# Patient Record
Sex: Male | Born: 1960 | Race: White | Hispanic: No | Smoking: Current every day smoker
Health system: Southern US, Community
[De-identification: ages and names within clinical notes are randomized; demographics above are authoritative.]

## PROBLEM LIST (undated history)

## (undated) DIAGNOSIS — E7800 Pure hypercholesterolemia, unspecified: Secondary | ICD-10-CM

## (undated) DIAGNOSIS — I38 Endocarditis, valve unspecified: Secondary | ICD-10-CM

## (undated) DIAGNOSIS — R000 Tachycardia, unspecified: Secondary | ICD-10-CM

## (undated) DIAGNOSIS — J449 Chronic obstructive pulmonary disease, unspecified: Secondary | ICD-10-CM

## (undated) DIAGNOSIS — S2249XA Multiple fractures of ribs, unspecified side, initial encounter for closed fracture: Secondary | ICD-10-CM

## (undated) DIAGNOSIS — I426 Alcoholic cardiomyopathy: Secondary | ICD-10-CM

## (undated) DIAGNOSIS — S2239XA Fracture of one rib, unspecified side, initial encounter for closed fracture: Secondary | ICD-10-CM

## (undated) HISTORY — PX: BACK SURGERY: SHX140

---

## 2005-02-15 ENCOUNTER — Emergency Department: Payer: Self-pay | Admitting: Emergency Medicine

## 2005-12-06 ENCOUNTER — Emergency Department: Payer: Self-pay | Admitting: Emergency Medicine

## 2006-01-31 ENCOUNTER — Ambulatory Visit: Payer: Self-pay | Admitting: General Practice

## 2006-02-24 ENCOUNTER — Emergency Department: Payer: Self-pay | Admitting: Emergency Medicine

## 2006-02-24 ENCOUNTER — Emergency Department: Payer: Self-pay

## 2008-02-12 ENCOUNTER — Encounter
Admission: RE | Admit: 2008-02-12 | Discharge: 2008-02-12 | Payer: Self-pay | Admitting: Physical Medicine & Rehabilitation

## 2008-05-21 ENCOUNTER — Inpatient Hospital Stay: Payer: Self-pay | Admitting: *Deleted

## 2008-05-22 ENCOUNTER — Inpatient Hospital Stay: Payer: Self-pay | Admitting: Psychiatry

## 2009-08-10 ENCOUNTER — Emergency Department: Payer: Self-pay | Admitting: Emergency Medicine

## 2009-11-20 ENCOUNTER — Emergency Department: Payer: Self-pay | Admitting: Emergency Medicine

## 2010-01-08 ENCOUNTER — Emergency Department: Payer: Self-pay | Admitting: Emergency Medicine

## 2010-02-04 ENCOUNTER — Inpatient Hospital Stay: Payer: Self-pay | Admitting: Psychiatry

## 2010-03-10 ENCOUNTER — Emergency Department: Payer: Self-pay | Admitting: Emergency Medicine

## 2010-03-12 ENCOUNTER — Emergency Department: Payer: Self-pay | Admitting: Emergency Medicine

## 2010-12-21 ENCOUNTER — Emergency Department: Payer: Self-pay | Admitting: Unknown Physician Specialty

## 2010-12-23 ENCOUNTER — Emergency Department: Payer: Self-pay | Admitting: Emergency Medicine

## 2010-12-30 ENCOUNTER — Emergency Department: Payer: Self-pay | Admitting: Emergency Medicine

## 2011-01-23 ENCOUNTER — Emergency Department: Payer: Self-pay | Admitting: Emergency Medicine

## 2011-03-16 ENCOUNTER — Emergency Department: Payer: Self-pay | Admitting: Emergency Medicine

## 2011-05-16 ENCOUNTER — Emergency Department: Payer: Self-pay | Admitting: Unknown Physician Specialty

## 2011-07-04 ENCOUNTER — Emergency Department: Payer: Self-pay | Admitting: Emergency Medicine

## 2011-08-03 ENCOUNTER — Emergency Department: Payer: Self-pay | Admitting: Emergency Medicine

## 2011-09-12 ENCOUNTER — Emergency Department: Payer: Self-pay

## 2011-09-12 LAB — CBC
HCT: 51.2 % (ref 40.0–52.0)
HGB: 17.4 g/dL (ref 13.0–18.0)
MCH: 33.9 pg (ref 26.0–34.0)
MCHC: 33.9 g/dL (ref 32.0–36.0)
MCV: 100 fL (ref 80–100)
RDW: 13.6 % (ref 11.5–14.5)
WBC: 7.1 10*3/uL (ref 3.8–10.6)

## 2011-09-12 LAB — URINALYSIS, COMPLETE
Bacteria: NONE SEEN
Glucose,UR: NEGATIVE mg/dL (ref 0–75)
Leukocyte Esterase: NEGATIVE
Nitrite: NEGATIVE
Ph: 5 (ref 4.5–8.0)
Protein: NEGATIVE
Specific Gravity: 1.001 (ref 1.003–1.030)
Squamous Epithelial: <1
WBC UR: 2 /HPF (ref 0–5)

## 2011-09-12 LAB — ETHANOL
Ethanol %: 0.143 % — ABNORMAL HIGH (ref 0.000–0.080)
Ethanol: 281 mg/dL
Ethanol: 143 mg/dL

## 2011-09-12 LAB — COMPREHENSIVE METABOLIC PANEL
Albumin: 4.2 g/dL (ref 3.4–5.0)
Alkaline Phosphatase: 59 U/L (ref 50–136)
BUN: 7 mg/dL (ref 7–18)
Bilirubin,Total: 0.4 mg/dL (ref 0.2–1.0)
Creatinine: 0.77 mg/dL (ref 0.60–1.30)
EGFR (Non-African Amer.): >60
Glucose: 100 mg/dL — ABNORMAL HIGH (ref 65–99)
Osmolality: 270 (ref 275–301)
SGPT (ALT): 60 U/L
Sodium: 136 mmol/L (ref 136–145)
Total Protein: 8.7 g/dL — ABNORMAL HIGH (ref 6.4–8.2)

## 2011-09-12 LAB — DRUG SCREEN, URINE
Barbiturates, Ur Screen: NEGATIVE (ref ?–200)
Cannabinoid 50 Ng, Ur ~~LOC~~: NEGATIVE (ref ?–50)
Cocaine Metabolite,Ur ~~LOC~~: NEGATIVE (ref ?–300)
MDMA (Ecstasy)Ur Screen: NEGATIVE (ref ?–500)
Methadone, Ur Screen: NEGATIVE (ref ?–300)
Phencyclidine (PCP) Ur S: NEGATIVE (ref ?–25)

## 2011-11-28 ENCOUNTER — Emergency Department: Payer: Self-pay | Admitting: Unknown Physician Specialty

## 2011-11-28 LAB — BASIC METABOLIC PANEL
BUN: 4 mg/dL — ABNORMAL LOW (ref 7–18)
Calcium, Total: 8.6 mg/dL (ref 8.5–10.1)
Chloride: 102 mmol/L (ref 98–107)
Co2: 25 mmol/L (ref 21–32)
EGFR (Non-African Amer.): >60
Glucose: 84 mg/dL (ref 65–99)
Osmolality: 266 (ref 275–301)

## 2011-11-28 LAB — CBC
HCT: 46.1 % (ref 40.0–52.0)
MCHC: 33.3 g/dL (ref 32.0–36.0)
Platelet: 176 10*3/uL (ref 150–440)
RBC: 4.51 10*6/uL (ref 4.40–5.90)
RDW: 14.3 % (ref 11.5–14.5)

## 2011-11-28 LAB — CK TOTAL AND CKMB (NOT AT ARMC): CK-MB: <0.5 ng/mL — ABNORMAL LOW (ref 0.5–3.6)

## 2011-12-10 ENCOUNTER — Emergency Department: Payer: Self-pay | Admitting: Emergency Medicine

## 2011-12-10 LAB — DRUG SCREEN, URINE
Barbiturates, Ur Screen: NEGATIVE (ref ?–200)
Benzodiazepine, Ur Scrn: NEGATIVE (ref ?–200)
Cannabinoid 50 Ng, Ur ~~LOC~~: NEGATIVE (ref ?–50)
MDMA (Ecstasy)Ur Screen: NEGATIVE (ref ?–500)
Methadone, Ur Screen: NEGATIVE (ref ?–300)
Phencyclidine (PCP) Ur S: NEGATIVE (ref ?–25)
Tricyclic, Ur Screen: NEGATIVE (ref ?–1000)

## 2011-12-10 LAB — CBC
HGB: 16.7 g/dL (ref 13.0–18.0)
MCH: 34.7 pg — ABNORMAL HIGH (ref 26.0–34.0)
MCV: 101 fL — ABNORMAL HIGH (ref 80–100)
Platelet: 162 10*3/uL (ref 150–440)
RDW: 14.7 % — ABNORMAL HIGH (ref 11.5–14.5)

## 2011-12-10 LAB — ETHANOL
Ethanol %: 0.331 % (ref 0.000–0.080)
Ethanol: 331 mg/dL

## 2011-12-10 LAB — COMPREHENSIVE METABOLIC PANEL
Albumin: 4.2 g/dL (ref 3.4–5.0)
Alkaline Phosphatase: 58 U/L (ref 50–136)
BUN: 4 mg/dL — ABNORMAL LOW (ref 7–18)
Chloride: 101 mmol/L (ref 98–107)
Co2: 26 mmol/L (ref 21–32)
Creatinine: 0.7 mg/dL (ref 0.60–1.30)
EGFR (Non-African Amer.): >60
Osmolality: 270 (ref 275–301)
SGOT(AST): 61 U/L — ABNORMAL HIGH (ref 15–37)
SGPT (ALT): 39 U/L
Sodium: 137 mmol/L (ref 136–145)

## 2011-12-10 LAB — SALICYLATE LEVEL: Salicylates, Serum: 4.2 mg/dL — ABNORMAL HIGH

## 2011-12-10 LAB — URINALYSIS, COMPLETE
Bacteria: NONE SEEN
Glucose,UR: NEGATIVE mg/dL (ref 0–75)
Protein: NEGATIVE
RBC,UR: NONE SEEN /HPF (ref 0–5)
Specific Gravity: 1.001 (ref 1.003–1.030)
Squamous Epithelial: NONE SEEN
WBC UR: <1 /HPF (ref 0–5)

## 2011-12-10 LAB — TSH: Thyroid Stimulating Horm: 5.22 u[IU]/mL — ABNORMAL HIGH

## 2011-12-10 LAB — ACETAMINOPHEN LEVEL: Acetaminophen: <2 ug/mL

## 2011-12-11 LAB — ETHANOL
Ethanol %: 0.043 % (ref 0.000–0.080)
Ethanol: 43 mg/dL

## 2011-12-16 ENCOUNTER — Emergency Department: Payer: Self-pay | Admitting: Emergency Medicine

## 2011-12-16 LAB — CBC
HCT: 44.5 % (ref 40.0–52.0)
HGB: 15.4 g/dL (ref 13.0–18.0)
MCHC: 34.6 g/dL (ref 32.0–36.0)
MCV: 101 fL — ABNORMAL HIGH (ref 80–100)
Platelet: 173 10*3/uL (ref 150–440)
RBC: 4.42 10*6/uL (ref 4.40–5.90)

## 2011-12-16 LAB — COMPREHENSIVE METABOLIC PANEL
BUN: 5 mg/dL — ABNORMAL LOW (ref 7–18)
Bilirubin,Total: 0.3 mg/dL (ref 0.2–1.0)
Calcium, Total: 8.2 mg/dL — ABNORMAL LOW (ref 8.5–10.1)
Chloride: 100 mmol/L (ref 98–107)
Co2: 21 mmol/L (ref 21–32)
EGFR (Non-African Amer.): >60
Potassium: 3.6 mmol/L (ref 3.5–5.1)
SGOT(AST): 57 U/L — ABNORMAL HIGH (ref 15–37)

## 2011-12-16 LAB — URINALYSIS, COMPLETE
Bacteria: NONE SEEN
Bilirubin,UR: NEGATIVE
Glucose,UR: NEGATIVE mg/dL (ref 0–75)
Leukocyte Esterase: NEGATIVE
Protein: NEGATIVE
RBC,UR: 1 /HPF (ref 0–5)
Squamous Epithelial: NONE SEEN
WBC UR: 1 /HPF (ref 0–5)

## 2011-12-16 LAB — PROTIME-INR
INR: 0.9
Prothrombin Time: 12 secs (ref 11.5–14.7)

## 2011-12-16 LAB — ETHANOL
Ethanol %: 0.338 % (ref 0.000–0.080)
Ethanol %: 0.2 % — ABNORMAL HIGH (ref 0.000–0.080)
Ethanol: 338 mg/dL
Ethanol: 200 mg/dL
Ethanol: 96 mg/dL

## 2011-12-16 LAB — CK TOTAL AND CKMB (NOT AT ARMC)
CK, Total: 115 U/L (ref 35–232)
CK-MB: 0.7 ng/mL (ref 0.5–3.6)

## 2011-12-26 ENCOUNTER — Emergency Department: Payer: Self-pay | Admitting: Emergency Medicine

## 2011-12-26 LAB — DRUG SCREEN, URINE
Barbiturates, Ur Screen: NEGATIVE (ref ?–200)
Benzodiazepine, Ur Scrn: NEGATIVE (ref ?–200)
Cannabinoid 50 Ng, Ur ~~LOC~~: NEGATIVE (ref ?–50)
Cocaine Metabolite,Ur ~~LOC~~: NEGATIVE (ref ?–300)
MDMA (Ecstasy)Ur Screen: NEGATIVE (ref ?–500)
Methadone, Ur Screen: NEGATIVE (ref ?–300)
Phencyclidine (PCP) Ur S: NEGATIVE (ref ?–25)
Tricyclic, Ur Screen: NEGATIVE (ref ?–1000)

## 2011-12-26 LAB — COMPREHENSIVE METABOLIC PANEL
Albumin: 3.7 g/dL (ref 3.4–5.0)
Anion Gap: 9 (ref 7–16)
BUN: 6 mg/dL — ABNORMAL LOW (ref 7–18)
Calcium, Total: 8.7 mg/dL (ref 8.5–10.1)
EGFR (African American): >60
EGFR (Non-African Amer.): >60
SGOT(AST): 50 U/L — ABNORMAL HIGH (ref 15–37)
SGPT (ALT): 49 U/L
Total Protein: 7.8 g/dL (ref 6.4–8.2)

## 2011-12-26 LAB — URINALYSIS, COMPLETE
Bacteria: NONE SEEN
Leukocyte Esterase: NEGATIVE
Nitrite: NEGATIVE
Protein: NEGATIVE
Squamous Epithelial: <1
WBC UR: <1 /HPF (ref 0–5)

## 2011-12-26 LAB — ETHANOL
Ethanol %: 0.297 % — ABNORMAL HIGH (ref 0.000–0.080)
Ethanol: 297 mg/dL

## 2011-12-26 LAB — CBC
MCHC: 34.2 g/dL (ref 32.0–36.0)
MCV: 102 fL — ABNORMAL HIGH (ref 80–100)
Platelet: 180 10*3/uL (ref 150–440)
RBC: 4.64 10*6/uL (ref 4.40–5.90)
RDW: 15.1 % — ABNORMAL HIGH (ref 11.5–14.5)
WBC: 7.5 10*3/uL (ref 3.8–10.6)

## 2011-12-27 LAB — ETHANOL
Ethanol %: 0.041 % (ref 0.000–0.080)
Ethanol: 41 mg/dL

## 2012-01-12 ENCOUNTER — Emergency Department: Payer: Self-pay | Admitting: Emergency Medicine

## 2012-01-12 LAB — COMPREHENSIVE METABOLIC PANEL
Albumin: 3.8 g/dL (ref 3.4–5.0)
Alkaline Phosphatase: 61 U/L (ref 50–136)
Anion Gap: 6 — ABNORMAL LOW (ref 7–16)
Bilirubin,Total: 0.3 mg/dL (ref 0.2–1.0)
Calcium, Total: 8.8 mg/dL (ref 8.5–10.1)
Creatinine: 0.73 mg/dL (ref 0.60–1.30)
EGFR (African American): >60
Glucose: 93 mg/dL (ref 65–99)
Osmolality: 270 (ref 275–301)
Potassium: 4.3 mmol/L (ref 3.5–5.1)
Sodium: 137 mmol/L (ref 136–145)

## 2012-01-12 LAB — DRUG SCREEN, URINE
Amphetamines, Ur Screen: NEGATIVE (ref ?–1000)
Barbiturates, Ur Screen: NEGATIVE (ref ?–200)
Benzodiazepine, Ur Scrn: NEGATIVE (ref ?–200)
Cannabinoid 50 Ng, Ur ~~LOC~~: NEGATIVE (ref ?–50)
Cocaine Metabolite,Ur ~~LOC~~: POSITIVE (ref ?–300)
Opiate, Ur Screen: NEGATIVE (ref ?–300)
Phencyclidine (PCP) Ur S: NEGATIVE (ref ?–25)
Tricyclic, Ur Screen: NEGATIVE (ref ?–1000)

## 2012-01-12 LAB — ETHANOL: Ethanol %: 0.32 % (ref 0.000–0.080)

## 2012-01-12 LAB — CBC
HGB: 16.3 g/dL (ref 13.0–18.0)
MCH: 35.1 pg — ABNORMAL HIGH (ref 26.0–34.0)
MCV: 102 fL — ABNORMAL HIGH (ref 80–100)
RBC: 4.66 10*6/uL (ref 4.40–5.90)

## 2012-01-12 LAB — SALICYLATE LEVEL: Salicylates, Serum: 3.5 mg/dL — ABNORMAL HIGH

## 2012-01-12 LAB — ACETAMINOPHEN LEVEL: Acetaminophen: <2 ug/mL

## 2012-01-15 ENCOUNTER — Emergency Department: Payer: Self-pay | Admitting: *Deleted

## 2012-01-15 LAB — COMPREHENSIVE METABOLIC PANEL
Albumin: 3.7 g/dL (ref 3.4–5.0)
Anion Gap: 9 (ref 7–16)
Calcium, Total: 8.4 mg/dL — ABNORMAL LOW (ref 8.5–10.1)
Chloride: 105 mmol/L (ref 98–107)
Co2: 27 mmol/L (ref 21–32)
EGFR (African American): >60
EGFR (Non-African Amer.): >60
Osmolality: 277 (ref 275–301)
Potassium: 3.5 mmol/L (ref 3.5–5.1)
Sodium: 141 mmol/L (ref 136–145)

## 2012-01-15 LAB — ETHANOL
Ethanol %: 0.222 % — ABNORMAL HIGH (ref 0.000–0.080)
Ethanol %: 0.005 % (ref 0.000–0.080)
Ethanol: 222 mg/dL

## 2012-01-15 LAB — URINALYSIS, COMPLETE
Bacteria: NONE SEEN
Bilirubin,UR: NEGATIVE
Blood: NEGATIVE
Glucose,UR: NEGATIVE mg/dL (ref 0–75)
Ketone: NEGATIVE
Protein: NEGATIVE
Specific Gravity: 1.002 (ref 1.003–1.030)
Squamous Epithelial: <1

## 2012-01-15 LAB — DRUG SCREEN, URINE
Amphetamines, Ur Screen: NEGATIVE (ref ?–1000)
Barbiturates, Ur Screen: NEGATIVE (ref ?–200)
Benzodiazepine, Ur Scrn: NEGATIVE (ref ?–200)
Cocaine Metabolite,Ur ~~LOC~~: NEGATIVE (ref ?–300)
Methadone, Ur Screen: NEGATIVE (ref ?–300)
Opiate, Ur Screen: NEGATIVE (ref ?–300)
Phencyclidine (PCP) Ur S: NEGATIVE (ref ?–25)
Tricyclic, Ur Screen: NEGATIVE (ref ?–1000)

## 2012-01-15 LAB — CBC
HCT: 46.8 % (ref 40.0–52.0)
MCH: 35.3 pg — ABNORMAL HIGH (ref 26.0–34.0)
MCHC: 34.3 g/dL (ref 32.0–36.0)
MCV: 103 fL — ABNORMAL HIGH (ref 80–100)
Platelet: 188 10*3/uL (ref 150–440)
WBC: 6.8 10*3/uL (ref 3.8–10.6)

## 2012-01-15 LAB — TSH: Thyroid Stimulating Horm: 5.45 u[IU]/mL — ABNORMAL HIGH

## 2012-03-08 ENCOUNTER — Emergency Department: Payer: Self-pay | Admitting: Emergency Medicine

## 2012-03-08 LAB — DRUG SCREEN, URINE
Amphetamines, Ur Screen: NEGATIVE (ref ?–1000)
Benzodiazepine, Ur Scrn: NEGATIVE (ref ?–200)
Cocaine Metabolite,Ur ~~LOC~~: POSITIVE (ref ?–300)
Methadone, Ur Screen: NEGATIVE (ref ?–300)
Tricyclic, Ur Screen: NEGATIVE (ref ?–1000)

## 2012-03-08 LAB — COMPREHENSIVE METABOLIC PANEL
Albumin: 3.5 g/dL (ref 3.4–5.0)
Alkaline Phosphatase: 58 U/L (ref 50–136)
Anion Gap: 10 (ref 7–16)
BUN: 3 mg/dL — ABNORMAL LOW (ref 7–18)
Glucose: 90 mg/dL (ref 65–99)
Osmolality: 268 (ref 275–301)
Potassium: 3.8 mmol/L (ref 3.5–5.1)
SGOT(AST): 89 U/L — ABNORMAL HIGH (ref 15–37)
SGPT (ALT): 84 U/L — ABNORMAL HIGH (ref 12–78)
Sodium: 136 mmol/L (ref 136–145)
Total Protein: 7.6 g/dL (ref 6.4–8.2)

## 2012-03-08 LAB — TSH: Thyroid Stimulating Horm: 3.48 u[IU]/mL

## 2012-03-08 LAB — SALICYLATE LEVEL: Salicylates, Serum: 3.9 mg/dL — ABNORMAL HIGH

## 2012-03-08 LAB — ACETAMINOPHEN LEVEL: Acetaminophen: <2 ug/mL

## 2012-03-08 LAB — TROPONIN I
Troponin-I: <0.02 ng/mL
Troponin-I: <0.02 ng/mL

## 2012-03-08 LAB — CBC
Platelet: 174 10*3/uL (ref 150–440)
RDW: 14.3 % (ref 11.5–14.5)
WBC: 6.8 10*3/uL (ref 3.8–10.6)

## 2012-03-19 ENCOUNTER — Emergency Department: Payer: Self-pay | Admitting: Emergency Medicine

## 2012-03-19 LAB — URINALYSIS, COMPLETE
Bacteria: NONE SEEN
Blood: NEGATIVE
Glucose,UR: NEGATIVE mg/dL (ref 0–75)
Ketone: NEGATIVE
Nitrite: NEGATIVE
Protein: NEGATIVE
Specific Gravity: 1.001 (ref 1.003–1.030)
Squamous Epithelial: NONE SEEN
WBC UR: NONE SEEN /HPF (ref 0–5)

## 2012-05-01 IMAGING — CR DG CHEST 1V PORT
1 series · 1 of 1 positions shown · non-contrast
Comparison: none

REASON FOR EXAM: Chest Pain
COMMENTS:

PROCEDURE:     DXR - DXR PORTABLE CHEST SINGLE VIEW  - February 04, 2010  [DATE]
RESULT:     Comparison is made to a prior study dated 11/20/2009.

[view not recorded]
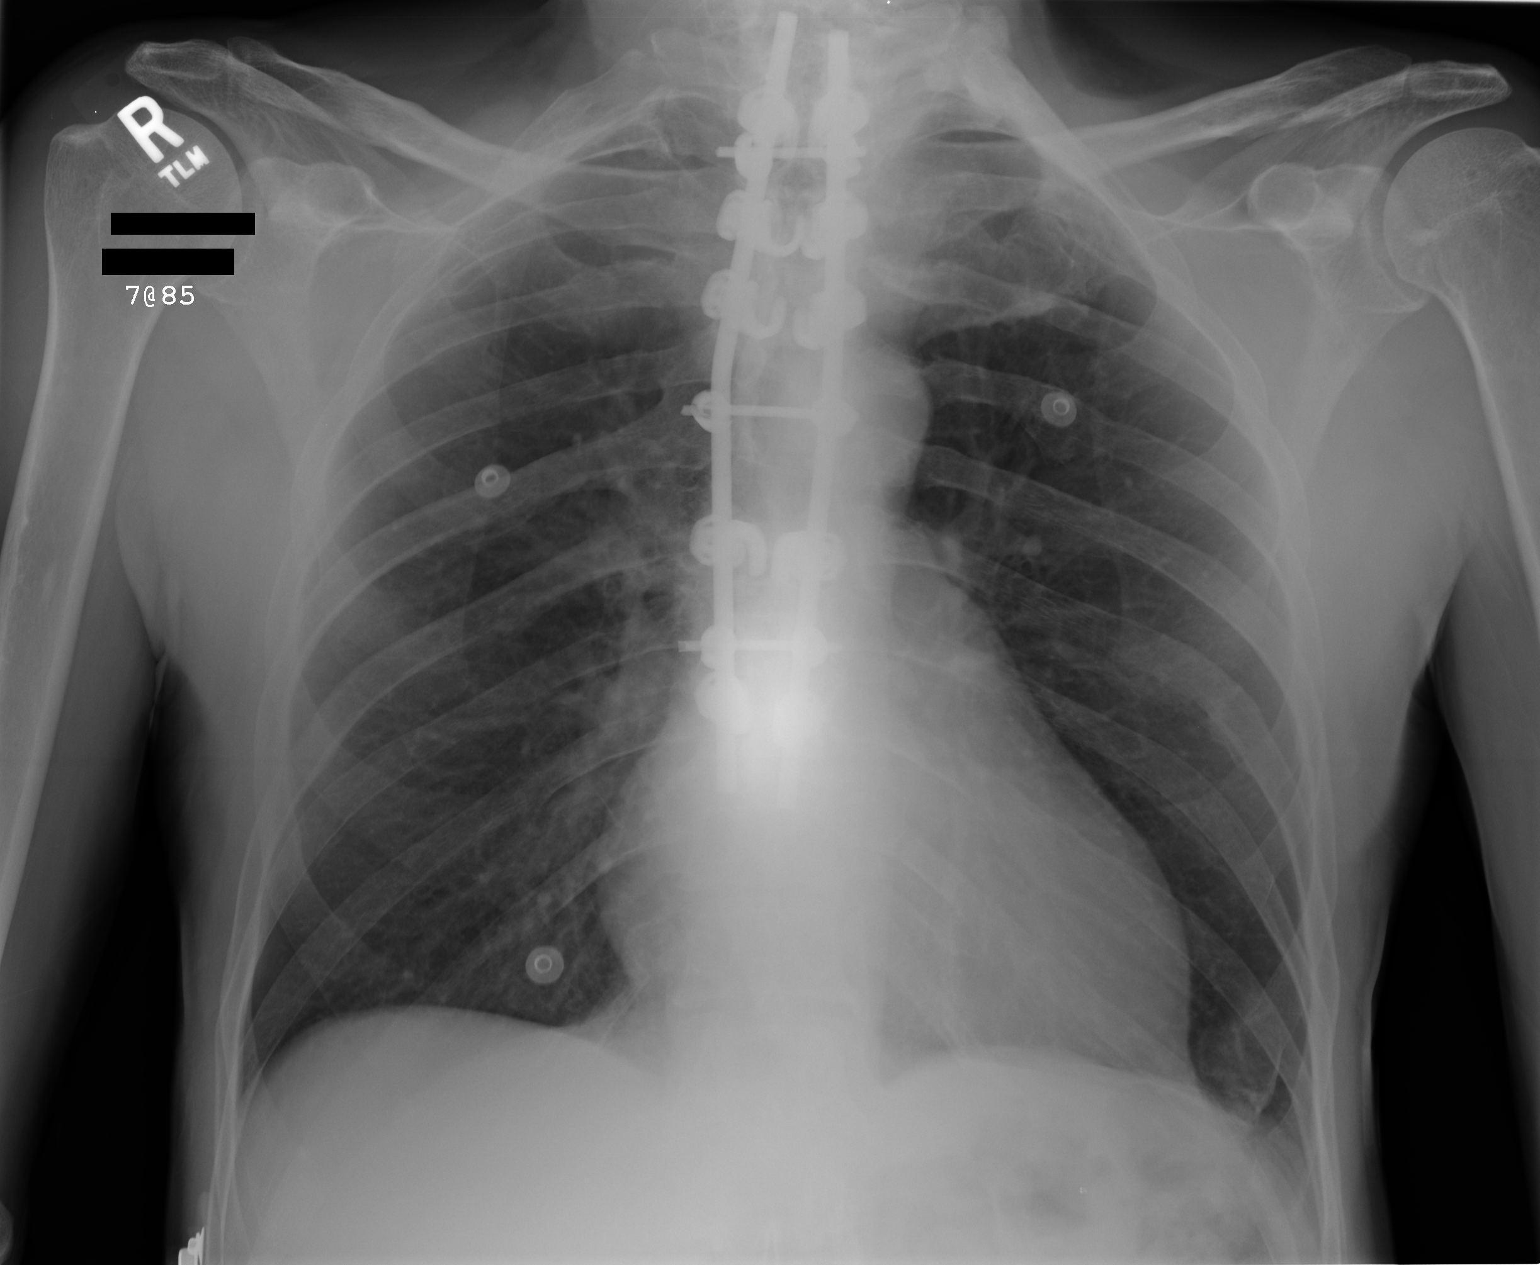

[1 of 1 positions shown; findings below may reference images not displayed]

FINDINGS: There is no evidence of focal infiltrates, effusions or edema. The
cardiac silhouette is within normal limits. The patient is status post rod
and screw fixation of the thoracic spine.
IMPRESSION: Chest radiograph without evidence of acute cardiopulmonary
disease.

## 2012-05-07 LAB — CBC WITH DIFFERENTIAL/PLATELET
Eosinophil %: 6.1 %
HCT: 49.9 % (ref 40.0–52.0)
HGB: 17.5 g/dL (ref 13.0–18.0)
Lymphocyte #: 2.5 10*3/uL (ref 1.0–3.6)
Lymphocyte %: 43.7 %
MCV: 101 fL — ABNORMAL HIGH (ref 80–100)
Monocyte %: 9.1 %
Neutrophil #: 2.3 10*3/uL (ref 1.4–6.5)
RBC: 4.93 10*6/uL (ref 4.40–5.90)
WBC: 5.7 10*3/uL (ref 3.8–10.6)

## 2012-05-07 LAB — COMPREHENSIVE METABOLIC PANEL
Albumin: 4 g/dL (ref 3.4–5.0)
BUN: 2 mg/dL — ABNORMAL LOW (ref 7–18)
Bilirubin,Total: 0.2 mg/dL (ref 0.2–1.0)
Creatinine: 0.55 mg/dL — ABNORMAL LOW (ref 0.60–1.30)
EGFR (African American): >60
Glucose: 94 mg/dL (ref 65–99)
Osmolality: 260 (ref 275–301)
Potassium: 3.9 mmol/L (ref 3.5–5.1)
SGOT(AST): 39 U/L — ABNORMAL HIGH (ref 15–37)
SGPT (ALT): 33 U/L (ref 12–78)
Total Protein: 8.7 g/dL — ABNORMAL HIGH (ref 6.4–8.2)

## 2012-05-07 LAB — DRUG SCREEN, URINE
Amphetamines, Ur Screen: NEGATIVE (ref ?–1000)
Barbiturates, Ur Screen: NEGATIVE (ref ?–200)
Benzodiazepine, Ur Scrn: NEGATIVE (ref ?–200)
MDMA (Ecstasy)Ur Screen: NEGATIVE (ref ?–500)
Methadone, Ur Screen: NEGATIVE (ref ?–300)
Phencyclidine (PCP) Ur S: NEGATIVE (ref ?–25)
Tricyclic, Ur Screen: POSITIVE (ref ?–1000)

## 2012-05-07 LAB — ETHANOL
Ethanol %: 0.425 % (ref 0.000–0.080)
Ethanol: 425 mg/dL

## 2012-05-08 ENCOUNTER — Inpatient Hospital Stay: Payer: Self-pay | Admitting: Psychiatry

## 2012-05-08 LAB — BASIC METABOLIC PANEL
Anion Gap: 15 (ref 7–16)
BUN: 4 mg/dL — ABNORMAL LOW (ref 7–18)
Chloride: 102 mmol/L (ref 98–107)
Co2: 22 mmol/L (ref 21–32)
Creatinine: 0.54 mg/dL — ABNORMAL LOW (ref 0.60–1.30)
EGFR (African American): >60
EGFR (Non-African Amer.): >60
Glucose: 52 mg/dL — ABNORMAL LOW (ref 65–99)
Osmolality: 272 (ref 275–301)

## 2012-05-08 LAB — ETHANOL
Ethanol %: 0.103 % — ABNORMAL HIGH (ref 0.000–0.080)
Ethanol: 103 mg/dL

## 2012-07-02 ENCOUNTER — Emergency Department: Payer: Self-pay | Admitting: Emergency Medicine

## 2012-07-02 LAB — DRUG SCREEN, URINE
Amphetamines, Ur Screen: NEGATIVE (ref ?–1000)
Benzodiazepine, Ur Scrn: NEGATIVE (ref ?–200)
Cannabinoid 50 Ng, Ur ~~LOC~~: NEGATIVE (ref ?–50)
Cocaine Metabolite,Ur ~~LOC~~: NEGATIVE (ref ?–300)
MDMA (Ecstasy)Ur Screen: NEGATIVE (ref ?–500)
Methadone, Ur Screen: NEGATIVE (ref ?–300)
Opiate, Ur Screen: NEGATIVE (ref ?–300)
Phencyclidine (PCP) Ur S: NEGATIVE (ref ?–25)

## 2012-07-02 LAB — CBC
HCT: 55.1 % — ABNORMAL HIGH (ref 40.0–52.0)
HGB: 18.3 g/dL — ABNORMAL HIGH (ref 13.0–18.0)
MCH: 34.6 pg — ABNORMAL HIGH (ref 26.0–34.0)
MCV: 104 fL — ABNORMAL HIGH (ref 80–100)
Platelet: 235 10*3/uL (ref 150–440)
RBC: 5.29 10*6/uL (ref 4.40–5.90)
WBC: 8.4 10*3/uL (ref 3.8–10.6)

## 2012-07-02 LAB — COMPREHENSIVE METABOLIC PANEL
Albumin: 4.1 g/dL (ref 3.4–5.0)
Alkaline Phosphatase: 63 U/L (ref 50–136)
Anion Gap: 10 (ref 7–16)
Calcium, Total: 9.2 mg/dL (ref 8.5–10.1)
Chloride: 103 mmol/L (ref 98–107)
EGFR (African American): >60
EGFR (Non-African Amer.): >60
Glucose: 87 mg/dL (ref 65–99)
Potassium: 4.9 mmol/L (ref 3.5–5.1)
SGOT(AST): 70 U/L — ABNORMAL HIGH (ref 15–37)

## 2012-07-02 LAB — URINALYSIS, COMPLETE
Bacteria: NONE SEEN
Bilirubin,UR: NEGATIVE
Glucose,UR: NEGATIVE mg/dL (ref 0–75)
Ketone: NEGATIVE
RBC,UR: <1 /HPF (ref 0–5)
Squamous Epithelial: NONE SEEN
WBC UR: 3 /HPF (ref 0–5)

## 2012-07-02 LAB — TSH: Thyroid Stimulating Horm: 7.17 u[IU]/mL — ABNORMAL HIGH

## 2012-07-02 LAB — ETHANOL: Ethanol: 334 mg/dL

## 2013-01-05 ENCOUNTER — Emergency Department: Payer: Self-pay | Admitting: Emergency Medicine

## 2013-01-06 LAB — COMPREHENSIVE METABOLIC PANEL
Albumin: 3.4 g/dL (ref 3.4–5.0)
Alkaline Phosphatase: 44 U/L — ABNORMAL LOW (ref 50–136)
Bilirubin,Total: 0.5 mg/dL (ref 0.2–1.0)
Calcium, Total: 8.5 mg/dL (ref 8.5–10.1)
Co2: 25 mmol/L (ref 21–32)
Creatinine: 0.73 mg/dL (ref 0.60–1.30)
EGFR (African American): >60
Glucose: 88 mg/dL (ref 65–99)
Osmolality: 268 (ref 275–301)
SGOT(AST): 58 U/L — ABNORMAL HIGH (ref 15–37)
Sodium: 136 mmol/L (ref 136–145)

## 2013-01-06 LAB — URINALYSIS, COMPLETE
Bacteria: NONE SEEN
Bilirubin,UR: NEGATIVE
Ph: 6 (ref 4.5–8.0)
Protein: NEGATIVE
RBC,UR: <1 /HPF (ref 0–5)
Specific Gravity: 1.003 (ref 1.003–1.030)
Squamous Epithelial: <1
WBC UR: <1 /HPF (ref 0–5)

## 2013-01-06 LAB — DRUG SCREEN, URINE
Amphetamines, Ur Screen: NEGATIVE (ref ?–1000)
Cannabinoid 50 Ng, Ur ~~LOC~~: NEGATIVE (ref ?–50)
Cocaine Metabolite,Ur ~~LOC~~: NEGATIVE (ref ?–300)
MDMA (Ecstasy)Ur Screen: NEGATIVE (ref ?–500)
Phencyclidine (PCP) Ur S: NEGATIVE (ref ?–25)
Tricyclic, Ur Screen: NEGATIVE (ref ?–1000)

## 2013-01-06 LAB — CBC
HCT: 46.7 % (ref 40.0–52.0)
MCH: 35.3 pg — ABNORMAL HIGH (ref 26.0–34.0)
MCHC: 34.7 g/dL (ref 32.0–36.0)
MCV: 102 fL — ABNORMAL HIGH (ref 80–100)
Platelet: 161 10*3/uL (ref 150–440)
RBC: 4.6 10*6/uL (ref 4.40–5.90)

## 2013-01-06 LAB — ETHANOL: Ethanol %: 0.29 % — ABNORMAL HIGH (ref 0.000–0.080)

## 2014-04-05 ENCOUNTER — Ambulatory Visit: Payer: Self-pay | Admitting: Gastroenterology

## 2014-04-05 LAB — HEPATIC FUNCTION PANEL A (ARMC)
AST: 36 U/L (ref 15–37)
Albumin: 3.7 g/dL (ref 3.4–5.0)
Alkaline Phosphatase: 36 U/L — ABNORMAL LOW
Bilirubin, Direct: 0.1 mg/dL (ref 0.00–0.20)
Bilirubin,Total: 0.4 mg/dL (ref 0.2–1.0)
SGPT (ALT): 41 U/L
Total Protein: 7.4 g/dL (ref 6.4–8.2)

## 2014-04-05 LAB — IRON AND TIBC
IRON BIND. CAP.(TOTAL): 298 ug/dL (ref 250–450)
IRON: 142 ug/dL (ref 65–175)
Iron Saturation: 48 %
Unbound Iron-Bind.Cap.: 156 ug/dL

## 2014-04-05 LAB — PROTIME-INR
INR: 0.9
Prothrombin Time: 12.1 secs (ref 11.5–14.7)

## 2014-04-05 LAB — FERRITIN: FERRITIN (ARMC): 151 ng/mL (ref 8–388)
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
## 2014-10-29 NOTE — Consult Note (Signed)
PATIENT NAMCarmine Savoy:  Neal, Joseph Neal MR#:  [id number] DATE OF BIRTH:  03/04/1961  DATE OF CONSULTATION:  01/07/2013  REFERRING PHYSICIAN:   CONSULTING PHYSICIAN:  Izola PriceFrances C. Jaclynn MajorGreason, MD  CHIEF COMPLAINT:  [name] is a 54 year old male with a long history of alcohol dependence, multiple hospitalizations and visits to the Emergency Department. He is presently complaining of drinking at least a case of beer a day and his last drink was the day before yesterday, 2 days ago.   He does not want any hospitalization or any drugs. He was seen by Dr. Garnetta BuddyFaheem and a consult was done on 01/06/2013. He will stay in the ED for reevaluation by Dr. Garnetta BuddyFaheem tomorrow, Thursday, 01/08/2013.    ____________________________ Izola PriceFrances C. Jaclynn MajorGreason, MD fcg:si D: 01/07/2013 14:48:12 ET T: 01/07/2013 15:03:35 ET JOB#: [id number]  cc: Izola PriceFrances C. Jaclynn MajorGreason, MD, <Dictator> Maryan PulsFRANCES C Glorimar Stroope MD ELECTRONICALLY SIGNED 01/08/2013 12:58

## 2014-10-31 NOTE — Consult Note (Signed)
PATIENT NAME:  Joseph Neal, Joseph Neal MR#:  [id number] DATE OF BIRTH:  [birth year]  DATE OF CONSULTATION:  01/15/2012  REFERRING PHYSICIAN:  Governor Rooksebecca Lord, MD  CONSULTING PHYSICIAN:  Jeovany Huitron B. Morgyn Marut, MD  REASON FOR CONSULTATION: To evaluate a intoxicated patient.   IDENTIFYING DATA: Mr. Joseph Neal is a 54 year old alcoholic.   CHIEF COMPLAINT: "I need to come in for a few days."   HISTORY OF PRESENT ILLNESS: Mr. Joseph Neal has a long history of alcoholism. He was in substance abuse treatment at [facility] for six months up until June. He relapsed on alcohol then. He returns to the Emergency Room severely intoxicated, stating that his brother, Joseph Neal, passed away yesterday and the patient got drunk. He was considering alcohol treatment again. Unfortunately, our experience with this patient is such that when intoxicated he is asking for help and treatment. Once he sobers up, he torpedoes all our efforts. His blood alcohol level on admission was over 0.4. He denies being suicidal or homicidal. He feels depressed, losing his sister not long ago, and now the brother. He does not wish to be treated for depression as he is never compliant with outpatient treatment.   PAST PSYCHIATRIC HISTORY: There are [facility], and [facility], always for alcohol detox. He does not follow up with a psychiatrist.  Dr. Lacie ScottsNiemeyer prescribes all his medications.   FAMILY PSYCHIATRIC HISTORY: He has a brother well known to us who is an end-stage alcoholic. His sister passed away from alcoholism not long ago.   PAST MEDICAL HISTORY:  1. Asthma.  2. Allergies.  3. Chronic obstructive pulmonary disease. 4. Gastroesophageal reflux disease.   MEDICATIONS ON ADMISSION: The patient takes no medicines.   ALLERGIES: No known drug allergies.  SOCIAL HISTORY: He is on Disability for back surgery. He lives independent. He has health insurance.    REVIEW OF SYSTEMS:  CONSTITUTIONAL: No fevers or chills. No weight changes. EYES: No double or blurred vision. ENT: No hearing loss. RESPIRATORY: No shortness of breath or cough. CARDIOVASCULAR: No chest pain or orthopnea. GASTROINTESTINAL: No abdominal pain, nausea, vomiting, or diarrhea. GU: No incontinence or frequency. ENDOCRINE: No heat or cold intolerance. LYMPHATIC: No anemia or easy bruising. INTEGUMENTARY: No acne or rash. MUSCULOSKELETAL: No muscle or joint pain. NEUROLOGIC: No tingling or weakness. PSYCHIATRIC: See history of present illness for details.   PHYSICAL EXAMINATION:  VITAL SIGNS: Blood pressure 96/65, pulse 92, respirations 20.   GENERAL: This is a slender male in no acute distress. The rest of the physical examination is deferred to his primary attending.   LABORATORY DATA: Chemistries are within normal limits. Blood alcohol level is 0.407. LFTs are within normal limits except for AST of 52. TSH 4.38. Urine tox screen negative for substances. CBC within normal limits with MCV of 103. Urinalysis is not suggestive of urinary tract infection.   MENTAL STATUS EXAMINATION: The patient is alert and oriented to person, place, time, and situation. He is somewhat cooperative but rather grumpy. He states that he wants to come to the hospital for a few days. There is good eye contact. His speech is of normal rhythm, rate, and volume. Mood is okay with flat affect. Thought processing is logical and goal oriented. Thought content: He denies suicidal or homicidal ideation. There are no delusions or paranoia. There are no auditory or visual hallucinations. His cognition is grossly intact, but he will not cooperate on the formal exam. His insight and judgment are pathetic.  SUICIDE RISK ASSESSMENT: This is a patient with a history of alcoholism, but not really  mood problems or suicide attempts, who continues to drink himself to death.   ASSESSMENT:  AXIS I:  1. Alcohol dependence.  2. Cocaine abuse.   AXIS  II: Deferred.   AXIS III:  1. Asthma.  2. Allergies.    AXIS IV: Substance abuse, primary support, recent loss.   AXIS V: Global Assessment of Functioning score is 40.   PLAN: The patient is intoxicated. We will wait for his blood alcohol level to drop so he can make a decision about treatment participation. I will continue CIWA protocol. I will follow up.   ____________________________ Ellin Goodie. Jennet Maduro, MD jbp:[id number] D: 01/15/2012 17:11:45 ET T: 01/15/2012 18:24:33 ET JOB#: [id number]  cc: Ambrea Hegler B. Jennet Maduro, MD, <Dictator> Shari Prows MD ELECTRONICALLY SIGNED 01/25/2012 6:06

## 2014-10-31 NOTE — Consult Note (Signed)
PATIENT NAMCarmine Savoy:  Oehlert, Bradshaw L MR#:  [id number] DATE OF BIRTH:  03-14-1961  DATE OF CONSULTATION:  12/11/2011  REFERRING PHYSICIAN:  Dr. Maricela BoLuna Ragsdale  CONSULTING PHYSICIAN:  Necia Kamm B. Jennet MaduroPucilowska, MD  IDENTIFYING DATA: Ms. [name] is a 54 year old male with history of alcoholism.   CHIEF COMPLAINT: "I am good."   HISTORY OF PRESENT ILLNESS: [name] has a long history of alcoholism. He just spent nine months at [facility] substance abuse treatment program but returned to drinking immediately after he was released. On the day of admission he was cutting the grass on a horse field and became short of breath due to allergies. He came to the hospital complaining of difficulties breathing. On the way, however, EMT worker noticed that the patient was drunk and talked him into coming to the hospital for alcohol detox. Initially the patient was in agreement with the plan, however, when we tried to admit him he changed his mind, minimized his drinking stating that he only drinks 3 times a day, does not need alcohol detox and even if he does he is going back to drinking. His blood alcohol level at the time of admission was 0.331. It is currently 0.043. The patient no longer is drunk. He is not suicidal, homicidal, psychotic or violent. He insists on going home. He denies any symptoms of depression, anxiety or psychosis or symptoms suggestive of bipolar mania. He denies other than alcohol substance use.   PAST PSYCHIATRIC HISTORY: Several previous hospital[facility], [facility] He was admitted for alcohol detox. He has never been really interested in substance abuse treatment. He does not have a psychiatrist and takes no medications. All the medicines are prescribed by Dr. Lacie ScottsNiemeyer.   FAMILY PSYCHIATRIC HISTORY: He has a brother who is an end-stage alcoholic and a sister who just passed away from alcoholism.   PAST MEDICAL HISTORY:  1. Asthma.  2. Allergies. 3. Chronic  obstructive pulmonary disease. 4. Gastroesophageal reflux disease.   MEDICATIONS ON ADMISSION: The patient takes no medicines.   ALLERGIES: No known drug allergies.   SOCIAL HISTORY: The patient is on disability from back surgery. He lives independently. He has health insurance.    REVIEW OF SYSTEMS: CONSTITUTIONAL: No fevers or chills. No weight changes. EYES: No double or blurred vision. ENT: No hearing loss. RESPIRATORY: Positive for shortness of breath. CARDIOVASCULAR: No chest pain or orthopnea. GASTROINTESTINAL: No abdominal pain, nausea, vomiting, or diarrhea. GENITOURINARY: No incontinence or frequency. ENDOCRINE: No heat or cold intolerance. LYMPHATIC: No anemia or easy bruising. INTEGUMENTARY: No acne or rash. MUSCULOSKELETAL: No muscle or joint pain. NEUROLOGIC: No tingling or weakness. PSYCHIATRIC: See history of present illness for details.   PHYSICAL EXAMINATION:  VITAL SIGNS: Blood pressure 85/65, pulse 78, respirations 20, temperature 96.6.   GENERAL: This is a well-developed male in no acute distress. The rest of the physical examination is deferred to his primary attending.   LABORATORY, DIAGNOSTIC, AND RADIOLOGICAL DATA: Chemistries are within normal limits except for low potassium 3.4. Blood alcohol level 0.331. LFTs: Total protein 8.8, AST 61. TSH 5.22. Urine tox screen is positive for cocaine. CBC within normal limits except for MCV of 101. Urinalysis is not suggestive of urinary tract infection. Serum acetaminophen and salicylates are low.   MENTAL STATUS EXAMINATION: The patient is asleep and not so easy to arouse even though his blood alcohol level came down. He is grumpy, marginally cooperative but initially agrees to come to the hospital. To my  surprise when admission nurse came to sign him in the patient refused treatment. He maintains poor eye contact. His speech is slow. Mood is okay with flat affect. Thought processing is logical and goal oriented. Thought content: He  denies suicidal or homicidal ideation. There are no delusions or paranoia. There are no auditory or visual hallucinations. His cognition is grossly intact but impossible to assess without patient's engagement. His insight and judgment are extremely poor.   SUICIDE RISK ASSESSMENT: This is a patient with long history of alcoholism but not mood problems or suicide attempts who unfortunately refused to get necessary treatment.   DIAGNOSES:  AXIS I:  1. Alcohol dependence.  2. Cocaine abuse.   AXIS II: Deferred.   AXIS III:  1. Asthma. 2. Allergies.    ALLERGIES: Substance abuse, primary support.   AXIS V: GAF on admission 25.   PLAN: The patient refuses admission to [facility] for alcohol detox or substance abuse treatment. He does not meet criteria for involuntary inpatient psychiatric commitment. Please discharge as appropriate. No medication is recommended at this point.  ____________________________ Ellin Goodie. Jennet Maduro, MD jbp:cms D: 12/11/2011 13:34:30 ET T: 12/11/2011 14:09:00 ET JOB#: [id number]  cc: Cori Henningsen B. Jennet Maduro, MD, <Dictator> Shari Prows MD ELECTRONICALLY SIGNED 12/11/2011 22:01

## 2014-10-31 NOTE — Consult Note (Signed)
Brief Consult Note: Diagnosis: Alcohol dependence.   Patient was seen by consultant.   Consult note dictated.   Recommend further assessment or treatment.   Orders entered.   Discussed with Attending MD.   Comments: The patient is not suicidal or homicidal. he initially requested alcohol detox but now refuses hospitalization.  PLAN: 1. The patient does not meet criteria for IVC.  2. The patient declines alcohol detox or substance abuse treatment program participation. He wants to continue drinking.  3. No medications indicated.   4. Please discharge as appropriate..  Electronic Signatures: Kristine LineaPucilowska, Braxon Suder (MD)  (Signed 04-Jun-13 13:20)  Authored: Brief Consult Note   Last Updated: 04-Jun-13 13:20 by Kristine LineaPucilowska, Elward Nocera (MD)

## 2014-10-31 NOTE — Consult Note (Signed)
Brief Consult Note: Diagnosis: Alcohol dependence.   Patient was seen by consultant.   Consult note dictated.   Recommend further assessment or treatment.   Orders entered.   Discussed with Attending MD.   Comments: Mr. Joseph Neal has a h/o alcoholism. He drinks at least 3-4 of 40 oz beers a day. He came to the ER really drunk after his brother Joseph Neal passed away yesterday. The patient is not suicidal or homicidal. He is not interested in treatment of depression. He has never been compliant with outpatient treatment. He requests alcohol detox. He is known to change his mind and refuse hospitalization.   PLAN: 1. The patient does not meet criteria for IVC.  2. Will continue alcohol detox.   3. The patient usually declines treatment. Will assess when BAL drops.   3. No medications indicated.   4. Please discharge as appropriate.  Electronic Signatures: Kristine LineaPucilowska, Yanitza Shvartsman (MD)  (Signed 09-Jul-13 11:31)  Authored: Brief Consult Note   Last Updated: 09-Jul-13 11:31 by Kristine LineaPucilowska, Jomarie Gellis (MD)

## 2014-12-13 ENCOUNTER — Encounter: Payer: Self-pay | Admitting: Emergency Medicine

## 2014-12-13 ENCOUNTER — Emergency Department: Payer: Medicaid Other

## 2014-12-13 ENCOUNTER — Observation Stay
Admission: EM | Admit: 2014-12-13 | Discharge: 2014-12-14 | Disposition: A | Discharge status: Home / Self Care | Payer: Medicaid Other | Attending: Internal Medicine | Admitting: Internal Medicine

## 2014-12-13 DIAGNOSIS — E860 Dehydration: Secondary | ICD-10-CM | POA: Insufficient documentation

## 2014-12-13 DIAGNOSIS — E871 Hypo-osmolality and hyponatremia: Principal | ICD-10-CM | POA: Diagnosis present

## 2014-12-13 DIAGNOSIS — I426 Alcoholic cardiomyopathy: Secondary | ICD-10-CM | POA: Insufficient documentation

## 2014-12-13 DIAGNOSIS — R000 Tachycardia, unspecified: Secondary | ICD-10-CM | POA: Diagnosis not present

## 2014-12-13 DIAGNOSIS — F17200 Nicotine dependence, unspecified, uncomplicated: Secondary | ICD-10-CM | POA: Diagnosis not present

## 2014-12-13 DIAGNOSIS — I872 Venous insufficiency (chronic) (peripheral): Secondary | ICD-10-CM | POA: Diagnosis not present

## 2014-12-13 DIAGNOSIS — Z8619 Personal history of other infectious and parasitic diseases: Secondary | ICD-10-CM | POA: Diagnosis not present

## 2014-12-13 DIAGNOSIS — R200 Anesthesia of skin: Secondary | ICD-10-CM | POA: Diagnosis not present

## 2014-12-13 DIAGNOSIS — R52 Pain, unspecified: Secondary | ICD-10-CM | POA: Diagnosis present

## 2014-12-13 DIAGNOSIS — F419 Anxiety disorder, unspecified: Secondary | ICD-10-CM | POA: Insufficient documentation

## 2014-12-13 DIAGNOSIS — F1010 Alcohol abuse, uncomplicated: Secondary | ICD-10-CM | POA: Diagnosis not present

## 2014-12-13 DIAGNOSIS — E780 Pure hypercholesterolemia: Secondary | ICD-10-CM | POA: Insufficient documentation

## 2014-12-13 DIAGNOSIS — I959 Hypotension, unspecified: Secondary | ICD-10-CM | POA: Diagnosis present

## 2014-12-13 DIAGNOSIS — I10 Essential (primary) hypertension: Secondary | ICD-10-CM | POA: Insufficient documentation

## 2014-12-13 DIAGNOSIS — R1011 Right upper quadrant pain: Secondary | ICD-10-CM | POA: Diagnosis not present

## 2014-12-13 DIAGNOSIS — J439 Emphysema, unspecified: Secondary | ICD-10-CM | POA: Diagnosis not present

## 2014-12-13 DIAGNOSIS — R51 Headache: Secondary | ICD-10-CM | POA: Insufficient documentation

## 2014-12-13 HISTORY — DX: Tachycardia, unspecified: R00.0

## 2014-12-13 HISTORY — DX: Alcoholic cardiomyopathy: I42.6

## 2014-12-13 HISTORY — DX: Endocarditis, valve unspecified: I38

## 2014-12-13 HISTORY — DX: Fracture of one rib, unspecified side, initial encounter for closed fracture: S22.39XA

## 2014-12-13 HISTORY — DX: Chronic obstructive pulmonary disease, unspecified: J44.9

## 2014-12-13 HISTORY — DX: Pure hypercholesterolemia, unspecified: E78.00

## 2014-12-13 HISTORY — DX: Multiple fractures of ribs, unspecified side, initial encounter for closed fracture: S22.49XA

## 2014-12-13 LAB — COMPREHENSIVE METABOLIC PANEL
ALBUMIN: 4.8 g/dL (ref 3.5–5.0)
ALK PHOS: 50 U/L (ref 38–126)
ALT: 34 U/L (ref 17–63)
ANION GAP: 11 (ref 5–15)
AST: 64 U/L — ABNORMAL HIGH (ref 15–41)
BILIRUBIN TOTAL: 0.7 mg/dL (ref 0.3–1.2)
BUN: 5 mg/dL — ABNORMAL LOW (ref 6–20)
CO2: 26 mmol/L (ref 22–32)
Calcium: 9.2 mg/dL (ref 8.9–10.3)
Chloride: 86 mmol/L — ABNORMAL LOW (ref 101–111)
Creatinine, Ser: 0.71 mg/dL (ref 0.61–1.24)
GFR calc Af Amer: >60 mL/min (ref >60)
Glucose, Bld: 96 mg/dL (ref 65–99)
Potassium: 4.2 mmol/L (ref 3.5–5.1)
Sodium: 123 mmol/L — ABNORMAL LOW (ref 135–145)
Total Protein: 8.6 g/dL — ABNORMAL HIGH (ref 6.5–8.1)

## 2014-12-13 LAB — CBC WITH DIFFERENTIAL/PLATELET
Basophils Absolute: 0.4 10*3/uL — ABNORMAL HIGH (ref 0–0.1)
Basophils Relative: 5 %
EOS ABS: 1 10*3/uL — AB (ref 0–0.7)
EOS PCT: 11 %
HCT: 45.1 % (ref 40.0–52.0)
Hemoglobin: 15.7 g/dL (ref 13.0–18.0)
LYMPHS ABS: 3 10*3/uL (ref 1.0–3.6)
LYMPHS PCT: 34 %
MCH: 34.6 pg — AB (ref 26.0–34.0)
MCHC: 34.8 g/dL (ref 32.0–36.0)
MCV: 99.5 fL (ref 80.0–100.0)
Monocytes Absolute: 0.6 10*3/uL (ref 0.2–1.0)
Monocytes Relative: 7 %
Neutro Abs: 3.8 10*3/uL (ref 1.4–6.5)
Neutrophils Relative %: 43 %
Platelets: 167 10*3/uL (ref 150–440)
RBC: 4.54 MIL/uL (ref 4.40–5.90)
RDW: 14.6 % — ABNORMAL HIGH (ref 11.5–14.5)
WBC: 8.9 10*3/uL (ref 3.8–10.6)

## 2014-12-13 LAB — ETHANOL: ALCOHOL ETHYL (B): 282 mg/dL — AB (ref <5)

## 2014-12-13 LAB — SODIUM
SODIUM: 131 mmol/L — AB (ref 135–145)
Sodium: 129 mmol/L — ABNORMAL LOW (ref 135–145)

## 2014-12-13 LAB — LIPASE, BLOOD: Lipase: 29 U/L (ref 22–51)

## 2014-12-13 MED ORDER — PRAVASTATIN SODIUM 20 MG PO TABS
40.0000 mg | ORAL_TABLET | Freq: Every day | ORAL | Status: DC
  Administered 2014-12-13 – 2014-12-14 (×2): 40 mg via ORAL
  Filled 2014-12-13 (×3): qty 2

## 2014-12-13 MED ORDER — CYCLOBENZAPRINE HCL 10 MG PO TABS: 5.0000 mg | ORAL_TABLET | Freq: Every day | ORAL | Status: DC | PRN

## 2014-12-13 MED ORDER — CLONAZEPAM 1 MG PO TABS
1.0000 mg | ORAL_TABLET | Freq: Three times a day (TID) | ORAL | Status: DC
  Administered 2014-12-13 – 2014-12-14 (×3): 1 mg via ORAL
  Filled 2014-12-13 (×3): qty 1

## 2014-12-13 MED ORDER — HEPARIN SODIUM (PORCINE) 5000 UNIT/ML IJ SOLN
5000.0000 [IU] | Freq: Three times a day (TID) | INTRAMUSCULAR | Status: DC
  Administered 2014-12-13 – 2014-12-14 (×3): 5000 [IU] via SUBCUTANEOUS
  Filled 2014-12-13 (×3): qty 1

## 2014-12-13 MED ORDER — SODIUM CHLORIDE 0.9 % IV BOLUS (SEPSIS)
1000.0000 mL | Freq: Once | INTRAVENOUS | Status: AC
  Administered 2014-12-13: 1000 mL via INTRAVENOUS

## 2014-12-13 MED ORDER — SODIUM CHLORIDE 0.9 % IJ SOLN
3.0000 mL | Freq: Two times a day (BID) | INTRAMUSCULAR | Status: DC
  Administered 2014-12-13: 3 mL via INTRAVENOUS

## 2014-12-13 MED ORDER — ALBUTEROL SULFATE (2.5 MG/3ML) 0.083% IN NEBU: 2.5000 mg | INHALATION_SOLUTION | Freq: Four times a day (QID) | RESPIRATORY_TRACT | Status: DC | PRN

## 2014-12-13 MED ORDER — DOCUSATE SODIUM 100 MG PO CAPS
100.0000 mg | ORAL_CAPSULE | Freq: Two times a day (BID) | ORAL | Status: DC
  Administered 2014-12-13 – 2014-12-14 (×3): 100 mg via ORAL
  Filled 2014-12-13 (×4): qty 1

## 2014-12-13 MED ORDER — ONDANSETRON HCL 4 MG PO TABS: 4.0000 mg | ORAL_TABLET | Freq: Four times a day (QID) | ORAL | Status: DC | PRN

## 2014-12-13 MED ORDER — MOMETASONE FURO-FORMOTEROL FUM 100-5 MCG/ACT IN AERO
2.0000 | INHALATION_SPRAY | Freq: Two times a day (BID) | RESPIRATORY_TRACT | Status: DC
  Administered 2014-12-13 (×2): 2 via RESPIRATORY_TRACT
  Filled 2014-12-13: qty 8.8

## 2014-12-13 MED ORDER — HYDROMORPHONE HCL 1 MG/ML IJ SOLN: 0.5000 mg | INTRAMUSCULAR | Status: DC | PRN

## 2014-12-13 MED ORDER — METOPROLOL SUCCINATE ER 100 MG PO TB24
100.0000 mg | ORAL_TABLET | Freq: Every day | ORAL | Status: DC
  Administered 2014-12-14: 100 mg via ORAL
  Filled 2014-12-13 (×3): qty 1

## 2014-12-13 MED ORDER — ALBUTEROL SULFATE HFA 108 (90 BASE) MCG/ACT IN AERS: 2.0000 | INHALATION_SPRAY | Freq: Four times a day (QID) | RESPIRATORY_TRACT | Status: DC | PRN

## 2014-12-13 MED ORDER — SODIUM CHLORIDE 0.9 % IV SOLN
INTRAVENOUS | Status: DC
  Administered 2014-12-13 – 2014-12-14 (×3): via INTRAVENOUS

## 2014-12-13 MED ORDER — ONDANSETRON HCL 4 MG/2ML IJ SOLN: 4.0000 mg | Freq: Four times a day (QID) | INTRAMUSCULAR | Status: DC | PRN

## 2014-12-13 MED ORDER — NICOTINE 14 MG/24HR TD PT24
14.0000 mg | MEDICATED_PATCH | Freq: Every day | TRANSDERMAL | Status: DC
  Administered 2014-12-13 – 2014-12-14 (×2): 14 mg via TRANSDERMAL
  Filled 2014-12-13 (×3): qty 1

## 2014-12-13 NOTE — ED Notes (Signed)
Pt with blood pressure of 80/52. Pt continues to have pwd skin. md notified. Order or ns bolus received.

## 2014-12-13 NOTE — ED Notes (Signed)
Pt transported to ultrasound from ct.

## 2014-12-13 NOTE — ED Notes (Signed)
Report to katie, rn 

## 2014-12-13 NOTE — ED Notes (Signed)
Pt provided with urinal. Pt provided with remote. Pt ringing call bell for small items approx every 2-5 minutes. Pt declines offer for warm blanket.

## 2014-12-13 NOTE — ED Notes (Addendum)
Pt with hypotension and sleeping. Pt lying on right side with left arm towards ceiling, left arm has blood pressure cuff in place. Pt refusing to return to back for more accurate blood pressure check. Pt with pwd skin. Pt awakes easily to verbal stimuli from rn, states "i want to fucking sleep, shit,"

## 2014-12-13 NOTE — H&P (Signed)
Joseph Neal is an 54 y.o. male.   Chief Complaint: "knot in his side" HPI: Patient presents emergency department complaining of flank pain. He states that he has had nausea and vomiting as well as diarrhea for a number of days. In the emergency department the patient was noted to be slurring his words and apparently inebriated which prompted a CT of his head which showed no abnormality. Laboratory evaluation showed hyponatremia. The patient was also significantly hypotensive which improved after 2 boluses of fluid. Due to the aforementioned problems the emergency department to called for admission.  Past Medical History  Diagnosis Date  . Tachycardia   . High cholesterol   . Rib fractures   . COPD (chronic obstructive pulmonary disease)   . Valvular insufficiency   . Cardiomyopathy, alcoholic     Past Surgical History  Procedure Laterality Date  . Back surgery      Family History  Problem Relation Age of Onset  . Coronary artery disease Father    Social History:  reports that he has been smoking Cigarettes.  He has a 20 pack-year smoking history. He has never used smokeless tobacco. He reports that he drinks about 12.6 oz of alcohol per week. He reports that he uses illicit drugs (Marijuana).  Allergies: No Known Allergies  Prior to Admission medications   Medication Sig Start Date End Date Taking? Authorizing Provider  albuterol (PROVENTIL HFA;VENTOLIN HFA) 108 (90 BASE) MCG/ACT inhaler Inhale 2 puffs into the lungs every 6 (six) hours as needed for wheezing or shortness of breath.   Yes Historical Provider, MD  cyclobenzaprine (FLEXERIL) 5 MG tablet Take 5 mg by mouth daily as needed for muscle spasms.   Yes Historical Provider, MD  diazepam (VALIUM) 10 MG tablet Take 10 mg by mouth 3 (three) times daily.   Yes Historical Provider, MD  lisinopril (PRINIVIL,ZESTRIL) 10 MG tablet Take 10 mg by mouth daily.   Yes Historical Provider, MD  metoprolol succinate (TOPROL-XL) 100 MG 24  hr tablet Take 100 mg by mouth daily. Take with or immediately following a meal.   Yes Historical Provider, MD  pravastatin (PRAVACHOL) 40 MG tablet Take 40 mg by mouth daily.   Yes Historical Provider, MD  tiotropium (SPIRIVA) 18 MCG inhalation capsule Place 18 mcg into inhaler and inhale daily.   Yes Historical Provider, MD     Results for orders placed or performed during the hospital encounter of 12/13/14 (from the past 48 hour(s))  CBC with Differential     Status: Abnormal   Collection Time: 12/13/14  1:19 AM  Result Value Ref Range   WBC 8.9 3.8 - 10.6 K/uL   RBC 4.54 4.40 - 5.90 MIL/uL   Hemoglobin 15.7 13.0 - 18.0 g/dL   HCT 45.1 40.0 - 52.0 %   MCV 99.5 80.0 - 100.0 fL   MCH 34.6 (H) 26.0 - 34.0 pg   MCHC 34.8 32.0 - 36.0 g/dL   RDW 14.6 (H) 11.5 - 14.5 %   Platelets 167 150 - 440 K/uL   Neutrophils Relative % 43 %   Neutro Abs 3.8 1.4 - 6.5 K/uL   Lymphocytes Relative 34 %   Lymphs Abs 3.0 1.0 - 3.6 K/uL   Monocytes Relative 7 %   Monocytes Absolute 0.6 0.2 - 1.0 K/uL   Eosinophils Relative 11 %   Eosinophils Absolute 1.0 (H) 0 - 0.7 K/uL   Basophils Relative 5 %   Basophils Absolute 0.4 (H) 0 - 0.1 K/uL  Comprehensive metabolic panel     Status: Abnormal   Collection Time: 12/13/14  1:19 AM  Result Value Ref Range   Sodium 123 (L) 135 - 145 mmol/L   Potassium 4.2 3.5 - 5.1 mmol/L   Chloride 86 (L) 101 - 111 mmol/L   CO2 26 22 - 32 mmol/L   Glucose, Bld 96 65 - 99 mg/dL   BUN 5 (L) 6 - 20 mg/dL   Creatinine, Ser 0.71 0.61 - 1.24 mg/dL   Calcium 9.2 8.9 - 10.3 mg/dL   Total Protein 8.6 (H) 6.5 - 8.1 g/dL   Albumin 4.8 3.5 - 5.0 g/dL   AST 64 (H) 15 - 41 U/L   ALT 34 17 - 63 U/L   Alkaline Phosphatase 50 38 - 126 U/L   Total Bilirubin 0.7 0.3 - 1.2 mg/dL   GFR calc non Af Amer >60 >60 mL/min   GFR calc Af Amer >60 >60 mL/min    Comment: (NOTE) The eGFR has been calculated using the CKD EPI equation. This calculation has not been validated in all clinical  situations. eGFR's persistently <60 mL/min signify possible Chronic Kidney Disease.    Anion gap 11 5 - 15  Ethanol     Status: Abnormal   Collection Time: 12/13/14  1:19 AM  Result Value Ref Range   Alcohol, Ethyl (B) 282 (H) <5 mg/dL    Comment:        LOWEST DETECTABLE LIMIT FOR SERUM ALCOHOL IS 11 mg/dL FOR MEDICAL PURPOSES ONLY   Lipase, blood     Status: None   Collection Time: 12/13/14  1:19 AM  Result Value Ref Range   Lipase 29 22 - 51 U/L   Ct Head Wo Contrast  12/13/2014   CLINICAL DATA:  Numbness.  Intoxicated.  Frontal headache.  EXAM: CT HEAD WITHOUT CONTRAST  TECHNIQUE: Contiguous axial images were obtained from the base of the skull through the vertex without intravenous contrast.  COMPARISON:  01/08/2010  FINDINGS: No intracranial hemorrhage, mass effect, or midline shift. No hydrocephalus. The basilar cisterns are patent. No evidence of territorial infarct. No intracranial fluid collection. Mild atrophy, advanced for age. Calvarium is intact. Included paranasal sinuses and mastoid air cells are well aerated.  IMPRESSION: 1.  No acute intracranial abnormality. 2. Atrophy, advanced for age.   Electronically Signed   By: Jeb Levering M.D.   On: 12/13/2014 05:52   US Abdomen Limited Ruq  12/13/2014   CLINICAL DATA:  Right upper quadrant pain.  History of hepatitis C.  EXAM: US ABDOMEN LIMITED - RIGHT UPPER QUADRANT  COMPARISON:  04/05/2014  FINDINGS: Gallbladder:  No gallstones or wall thickening visualized. No sonographic Murphy sign noted.  Common bile duct:  Diameter: 4.7 mm, normal.  Liver:  No focal lesion identified. Within normal limits in parenchymal echogenicity. Normal directional flow in the main portal vein.  IMPRESSION: Normal right upper quadrant ultrasound.   Electronically Signed   By: Jeb Levering M.D.   On: 12/13/2014 06:24    Review of Systems  Constitutional: Negative for fever and chills.  HENT: Negative for sore throat and tinnitus.   Eyes:  Negative for blurred vision and redness.  Respiratory: Negative for cough and shortness of breath.   Cardiovascular: Negative for chest pain, palpitations, orthopnea and PND.  Gastrointestinal: Negative for nausea, vomiting, abdominal pain and diarrhea.  Genitourinary: Positive for flank pain. Negative for dysuria, urgency and frequency.  Musculoskeletal: Negative for myalgias and joint pain.  Skin: Negative  for rash.       No lesions  Neurological: Negative for speech change, focal weakness and weakness.  Endo/Heme/Allergies: Does not bruise/bleed easily.       No temperature intolerance  Psychiatric/Behavioral: Negative for depression and suicidal ideas.    Blood pressure 102/76, pulse 51, temperature 97.5 F (36.4 C), temperature source Oral, resp. rate 18, height _0  (1.753 m), weight 63.504 kg (140 lb), SpO2 100 %. Physical Exam  Nursing note and vitals reviewed. Constitutional: He is oriented to person, place, and time. He appears well-developed and well-nourished. No distress.  HENT:  Head: Normocephalic and atraumatic.  Mouth/Throat: Oropharynx is clear and moist.  Eyes: Conjunctivae and EOM are normal. Pupils are equal, round, and reactive to light.  Neck: Normal range of motion. Neck supple. No JVD present. No tracheal deviation present. No thyromegaly present.  Cardiovascular: Normal rate, regular rhythm, normal heart sounds and intact distal pulses.  Exam reveals no gallop and no friction rub.   No murmur heard. Respiratory: Effort normal. No respiratory distress. He has wheezes (expiratory).  GI: Soft. Bowel sounds are normal. He exhibits no distension. There is no tenderness.  Genitourinary:  Deferred  Musculoskeletal: Normal range of motion. He exhibits no edema.  Lymphadenopathy:    He has no cervical adenopathy.  Neurological: He is alert and oriented to person, place, and time. No cranial nerve deficit.  Skin: Skin is warm and dry. No erythema.  Psychiatric: He  has a normal mood and affect. His behavior is normal. Thought content normal.  Difficult to assess judgment as patient is mildly inebriated     Assessment/Plan This is a 54 year old Caucasian male admitted for hyponatremia and hypotension.  1. Hyponatremia: Secondary to alcohol abuse. I placed the patient on a free water restriction and we will hydrate him with normal saline to improve his sodium. He has no significant mental status changes. 2. Hypotension: Resolved. Likely secondary to dehydration. The patient has no signs or symptoms of sepsis or bleeding. 3. COPD: Emphysema. Continue albuterol as needed. I will also start the patient on a inhaled corticosteroid. 4. Cardiomyopathy: Alcohol induced. Due to the patient's original hypotension I will hold his lisinopril at this time. Once he is consistently normotensive he may restart his antihypertensive medication. 5. Alcohol abuse: CIWA protocol 6. Tobacco abuse: order NicoDerm patch 7. DVT prophylaxis: Heparin (check PT/INR) 8. GI prophylaxis: To blocker as needed for suspected gastritis The patient is a full code. Time spent on admission orders and patient care approximately 35 minutes.   Harrie Foreman 12/13/2014, 6:59 AM

## 2014-12-13 NOTE — ED Notes (Signed)
Pt states has had right hand numbness for one month and left leg numbness since 2002. Pt with etoh ingestion this pm. Pt states also has rlq pain. Pt states has been vomiting.

## 2014-12-13 NOTE — ED Notes (Signed)
Blood pressure 84/55. Order for ns bolus received from dr. Zenda AlpersWebster. Pt sleeping. 3+ bilateral radial pulses noted. Skin warm and dry, normal color.

## 2014-12-13 NOTE — ED Notes (Signed)
Pt also complains of frontal headache.

## 2014-12-13 NOTE — ED Notes (Signed)
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 What's wrong with you guys, this is shit." pt notified of NPO status per md.

## 2014-12-13 NOTE — ED Notes (Signed)
Patient transported to Ultrasound 

## 2014-12-13 NOTE — Progress Notes (Signed)
[facility] - [place] at [facility]   PATIENT NAME: Joseph Neal    MR#:  [id number]  DATE OF BIRTH:  [birth year]  SUBJECTIVE:  CHIEF COMPLAINT:   patient is resting comfortably.Denies any complaints.   REVIEW OF SYSTEMS:  CONSTITUTIONAL: No fever, fatigue or weakness.  EYES: No blurred or double vision.  EARS, NOSE, AND THROAT: No tinnitus or ear pain.  RESPIRATORY: No cough, shortness of breath, wheezing or hemoptysis.  CARDIOVASCULAR: No chest pain, orthopnea, edema.  GASTROINTESTINAL: No nausea, vomiting, diarrhea or abdominal pain.  GENITOURINARY: No dysuria, hematuria.  ENDOCRINE: No polyuria, nocturia,  HEMATOLOGY: No anemia, easy bruising or bleeding SKIN: No rash or lesion. MUSCULOSKELETAL: No joint pain or arthritis.   NEUROLOGIC: No tingling, numbness, weakness.  PSYCHIATRY: No anxiety or depression.   DRUG ALLERGIES:  No Known Allergies  VITALS:  Blood pressure 82/53, pulse 66, temperature 97.8 F (36.6 C), temperature source Oral, resp. rate 18, height  (1.753 m), weight 63.504 kg (140 lb), SpO2 100 %.  PHYSICAL EXAMINATION:  GENERAL:  54 y.o.-year-old patient lying in the bed with no acute distress.  EYES: Pupils equal, round, reactive to light and accommodation. No scleral icterus. Extraocular muscles intact.  HEENT: Head atraumatic, normocephalic. Oropharynx and nasopharynx clear.  NECK:  Supple, no jugular venous distention. No thyroid enlargement, no tenderness.  LUNGS: Normal breath sounds bilaterally, no wheezing, rales,rhonchi or crepitation. No use of accessory muscles of respiration.  CARDIOVASCULAR: S1, S2 normal. No murmurs, rubs, or gallops.  ABDOMEN: Soft, nontender, nondistended. Bowel sounds present. No organomegaly or mass.  EXTREMITIES: No pedal edema, cyanosis, or clubbing.  NEUROLOGIC: Cranial nerves II through XII are intact. Muscle strength 5/5 in all extremities. Sensation intact. Gait not checked.   PSYCHIATRIC: The patient is alert and oriented x 3.  SKIN: No obvious rash, lesion, or ulcer.    LABORATORY PANEL:   CBC  Recent Labs Lab 12/13/14 0119  WBC 8.9  HGB 15.7  HCT 45.1  PLT 167   ------------------------------------------------------------------------------------------------------------------  Chemistries   Recent Labs Lab 12/13/14 0119  NA 123*  K 4.2  CL 86*  CO2 26  GLUCOSE 96  BUN 5*  CREATININE 0.71  CALCIUM 9.2  AST 64*  ALT 34  ALKPHOS 50  BILITOT 0.7   ------------------------------------------------------------------------------------------------------------------  Cardiac Enzymes No results for input(s): TROPONINI in the last 168 hours. ------------------------------------------------------------------------------------------------------------------  RADIOLOGY:  Ct Head Wo Contrast  12/13/2014   CLINICAL DATA:  Numbness.  Intoxicated.  Frontal headache.  EXAM: CT HEAD WITHOUT CONTRAST  TECHNIQUE: Contiguous axial images were obtained from the base of the skull through the vertex without intravenous contrast.  COMPARISON:  01/08/2010  FINDINGS: No intracranial hemorrhage, mass effect, or midline shift. No hydrocephalus. The basilar cisterns are patent. No evidence of territorial infarct. No intracranial fluid collection. Mild atrophy, advanced for age. Calvarium is intact. Included paranasal sinuses and mastoid air cells are well aerated.  IMPRESSION: 1.  No acute intracranial abnormality. 2. Atrophy, advanced for age.   Electronically Signed   By: Rubye Oaks M.D.   On: 12/13/2014 05:52   US Abdomen Limited Ruq  12/13/2014   CLINICAL DATA:  Right upper quadrant pain.  History of hepatitis C.  EXAM: US ABDOMEN LIMITED - RIGHT UPPER QUADRANT  COMPARISON:  04/05/2014  FINDINGS: Gallbladder:  No gallstones or wall thickening visualized. No sonographic Murphy sign noted.  Common bile duct:  Diameter: 4.7 mm, normal.  Liver:  No focal lesion  identified. Within  normal limits in parenchymal echogenicity. Normal directional flow in the main portal vein.  IMPRESSION: Normal right upper quadrant ultrasound.   Electronically Signed   By: Rubye OaksMelanie  Ehinger M.D.   On: 12/13/2014 06:24    EKG:  No orders found for this or any previous visit.  ASSESSMENT AND PLAN:   #. Hyponatremia: Secondary to alcohol abuse.  patient on a free water restriction and we will hydrate him with normal saline to improve his sodium. He has no significant mental status changes. Check am labs  #. Hypotension: . Likely secondary to dehydration. Continue IV fluids. Hold blood pressure medications   #. COPD: Emphysema. Continue albuterol as needed.  on a inhaled corticosteroid.  #. Cardiomyopathy: Alcohol induced. Due to the patient's original hypotension I will hold his lisinopril at this time. Once he is consistently normotensive he may restart his antihypertensive medication.  #. Alcohol abuse: CIWA protocol  #Anxiety-patient reports he takes Klonopin 1 mg by mouth every 8 hours. I have requested our pharmacy to verify this   #. Tobacco abuse: order NicoDerm patch  #. DVT prophylaxis: Heparin (check PT/INR)     All the records are reviewed and case discussed with Care Management/Social Workerr. Management plans discussed with the patient, family and they are in agreement.  CODE STATUS: Full code TOTAL TIME TAKING CARE OF THIS PATIENT: 30 minutes.   POSSIBLE D/C IN 2-3 DAYS, DEPENDING ON CLINICAL CONDITION.   Ramonita LabGouru, Phares Zaccone M.D on 12/13/2014 at 3:20 PM  Between 7am to 6pm - Pager - [phone] After 6pm go to [email or web address] - password [email or web address]  [facility]  Office  [phone]  CC: Primary care physician; No primary care provider on file.

## 2014-12-13 NOTE — ED Notes (Signed)
Pt using callbell; asking for something to drink; understands will ask MD

## 2014-12-13 NOTE — ED Notes (Signed)
Pt sleeping, resps unlabored.  

## 2014-12-13 NOTE — ED Notes (Signed)
Pt's blood pressure obtained manually in ultrasound.

## 2014-12-13 NOTE — ED Notes (Signed)
Patient transported to CT 
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
## 2014-12-14 LAB — BASIC METABOLIC PANEL
Anion gap: 7 (ref 5–15)
BUN: 10 mg/dL (ref 6–20)
CO2: 24 mmol/L (ref 22–32)
CREATININE: 0.8 mg/dL (ref 0.61–1.24)
Calcium: 8.5 mg/dL — ABNORMAL LOW (ref 8.9–10.3)
Chloride: 101 mmol/L (ref 101–111)
GFR calc non Af Amer: >60 mL/min (ref >60)
Glucose, Bld: 88 mg/dL (ref 65–99)
Potassium: 4.6 mmol/L (ref 3.5–5.1)
Sodium: 132 mmol/L — ABNORMAL LOW (ref 135–145)

## 2014-12-14 LAB — SODIUM: Sodium: 132 mmol/L — ABNORMAL LOW (ref 135–145)

## 2014-12-14 MED ORDER — NICOTINE 14 MG/24HR TD PT24: 14.0000 mg | MEDICATED_PATCH | Freq: Every day | TRANSDERMAL | Status: AC

## 2014-12-14 NOTE — Progress Notes (Signed)
Alert and oriented. Vss. No signs of acute distress. Discharge instructions given to patient. Patient verbalized understanding. No other issues. Offered a wheelchair for discharge but patient refused.

## 2014-12-14 NOTE — Discharge Summary (Signed)
[facility] [place] [facility]   PATIENT NAME: Joseph Neal    MR#:  [id number]  DATE OF BIRTH:  [birth year]  DATE OF ADMISSION:  12/13/2014 ADMITTING PHYSICIAN: Arnaldo NatalMichael S Diamond, MD  DATE OF DISCHARGE: 12/14/14 PRIMARY CARE PHYSICIAN:   ADMISSION DIAGNOSIS:  Hyponatremia [E87.1] Pain [R52] Hypotension, unspecified hypotension type [I95.9]  DISCHARGE DIAGNOSIS:  Active Problems:   Hyponatremia   SECONDARY DIAGNOSIS:   Past Medical History  Diagnosis Date  . Tachycardia   . High cholesterol   . Rib fractures   . COPD (chronic obstructive pulmonary disease)   . Valvular insufficiency   . Cardiomyopathy, alcoholic     HOSPITAL COURSE:  Brief history and physical Please review history and physical for complete details. Patient came into the ED with a chief complaint of flank pain. He was having nausea and vomiting CAT scan of the head with no abnormality. Labs have revealed hyponatremia. Patient was admitted to the hospital.   Hospital course  #. Hyponatremia: Secondary to alcohol abuse. patient on a free water restriction and hydrated  him with normal saline to improve his sodium. With IV fluids and free water restriction his sodium significantly improved and it is at 132 today. He has no significant mental status changes. Nausea and vomiting is resolved  #. Hypotension: . Likely secondary to dehydration. Improved with IV fluids. Resuming his home medications for hypertension  #. COPD: Emphysema. Continue albuterol as needed. on a inhaled corticosteroid.  #. Cardiomyopathy: Alcohol induced. Due to the patient's original hypotension I will hold his lisinopril at this time. Once he is consistently normotensive he may restart his antihypertensive medication.  #. Alcohol abuse: CIWA protocol was provided  #Anxiety-patient reports he takes Klonopin 1 mg by mouth every 8 hours. Verified with the patient's pharmacy and resumed  #. Tobacco abuse:  order NicoDerm patch  #. DVT prophylaxis: Heparin (check PT/INR) subcutaneous was provided  Clinical situation is better and his getting discharged home today Patient prefers following up with alcohol rehabilitation as an outpatient     DISCHARGE CONDITIONS:   satisfactory CONSULTS OBTAINED:    none  PROCEDURES none  DRUG ALLERGIES:  No Known Allergies  DISCHARGE MEDICATIONS:   Current Discharge Medication List    START taking these medications   Details  nicotine (NICODERM CQ - DOSED IN MG/24 HOURS) 14 mg/24hr patch Place 1 patch (14 mg total) onto the skin daily. Qty: 28 patch, Refills: 0      CONTINUE these medications which have NOT CHANGED   Details  albuterol (PROVENTIL HFA;VENTOLIN HFA) 108 (90 BASE) MCG/ACT inhaler Inhale 2 puffs into the lungs every 6 (six) hours as needed for wheezing or shortness of breath.    clonazePAM (KLONOPIN) 1 MG tablet Take 1 mg by mouth 3 (three) times daily.    cyclobenzaprine (FLEXERIL) 5 MG tablet Take 5 mg by mouth 3 (three) times daily as needed for muscle spasms.     lisinopril (PRINIVIL,ZESTRIL) 10 MG tablet Take 10 mg by mouth daily.    metoprolol succinate (TOPROL-XL) 100 MG 24 hr tablet Take 100 mg by mouth daily.     pravastatin (PRAVACHOL) 40 MG tablet Take 40 mg by mouth daily.    tiotropium (SPIRIVA) 18 MCG inhalation capsule Place 18 mcg into inhaler and inhale daily.         DISCHARGE INSTRUCTIONS:   Follow-up with primary care physician in a week Follow-up with primary cardiology in 1-2 weeks regarding cardiomyopathy Follow-up with  outpatient on call rehabilitation Center in a week next and continue nicotine patch  DIET:  Low-salt healthy heart  DISCHARGE CONDITION:  Satisfactory  ACTIVITY:   As tolerated OXYGEN:  Home Oxygen: No.   Oxygen Delivery: room air  DISCHARGE LOCATION:  home   If you experience worsening of your admission symptoms, develop shortness of breath, life threatening  emergency, suicidal or homicidal thoughts you must seek medical attention immediately by calling 911 or calling your MD immediately  if symptoms less severe.  You Must read complete instructions/literature along with all the possible adverse reactions/side effects for all the Medicines you take and that have been prescribed to you. Take any new Medicines after you have completely understood and accpet all the possible adverse reactions/side effects.   Please note  You were cared for by a hospitalist during your hospital stay. If you have any questions about your discharge medications or the care you received while you were in the hospital after you are discharged, you can call the unit and asked to speak with the hospitalist on call if the hospitalist that took care of you is not available. Once you are discharged, your primary care physician will handle any further medical issues. Please note that NO REFILLS for any discharge medications will be authorized once you are discharged, as it is imperative that you return to your primary care physician (or establish a relationship with a primary care physician if you do not have one) for your aftercare needs so that they can reassess your need for medications and monitor your lab values.     Today  Chief Complaint  Patient presents with  . Numbness   Denies any complaints today. Tolerating diet and wants to be discharged home  ROS:  CONSTITUTIONAL: Denies fevers, chills. Denies any fatigue, weakness.  EYES: Denies blurry vision, double vision, eye pain. EARS, NOSE, THROAT: Denies tinnitus, ear pain, hearing loss. RESPIRATORY: Denies cough, wheeze, shortness of breath.  CARDIOVASCULAR: Denies chest pain, palpitations, edema.  GASTROINTESTINAL: Denies nausea, vomiting, diarrhea, abdominal pain. Denies bright red blood per rectum. GENITOURINARY: Denies dysuria, hematuria. ENDOCRINE: Denies nocturia or thyroid problems. HEMATOLOGIC AND LYMPHATIC:  Denies easy bruising or bleeding. SKIN: Denies rash or lesion. MUSCULOSKELETAL: Denies pain in neck, back, shoulder, knees, hips or arthritic symptoms.  NEUROLOGIC: Denies paralysis, paresthesias.  PSYCHIATRIC: Denies anxiety or depressive symptoms.   VITAL SIGNS:  Blood pressure 121/77, pulse 62, temperature 98.1 F (36.7 C), temperature source Oral, resp. rate 20, height  (1.753 m), weight 63.504 kg (140 lb), SpO2 97 %.  I/O:   Intake/Output Summary (Last 24 hours) at 12/14/14 1424 Last data filed at 12/14/14 1238  Gross per 24 hour  Intake 2889.96 ml  Output    750 ml  Net 2139.96 ml    PHYSICAL EXAMINATION:  GENERAL:  54 y.o.-year-old patient lying in the bed with no acute distress.  EYES: Pupils equal, round, reactive to light and accommodation. No scleral icterus. Extraocular muscles intact.  HEENT: Head atraumatic, normocephalic. Oropharynx and nasopharynx clear.  NECK:  Supple, no jugular venous distention. No thyroid enlargement, no tenderness.  LUNGS: Normal breath sounds bilaterally, no wheezing, rales,rhonchi or crepitation. No use of accessory muscles of respiration.  CARDIOVASCULAR: S1, S2 normal. No murmurs, rubs, or gallops.  ABDOMEN: Soft, non-tender, non-distended. Bowel sounds present. No organomegaly or mass.  EXTREMITIES: No pedal edema, cyanosis, or clubbing.  NEUROLOGIC: Cranial nerves II through XII are intact. Muscle strength 5/5 in all extremities. Sensation intact. Gait  not checked.  PSYCHIATRIC: The patient is alert and oriented x 3.  SKIN: No obvious rash, lesion, or ulcer.   DATA REVIEW:   CBC  Recent Labs Lab 12/13/14 0119  WBC 8.9  HGB 15.7  HCT 45.1  PLT 167    Chemistries   Recent Labs Lab 12/13/14 0119  12/14/14 0305  NA 123*  < > 132*  132*  K 4.2  --  4.6  CL 86*  --  101  CO2 26  --  24  GLUCOSE 96  --  88  BUN 5*  --  10  CREATININE 0.71  --  0.80  CALCIUM 9.2  --  8.5*  AST 64*  --   --   ALT 34  --   --    ALKPHOS 50  --   --   BILITOT 0.7  --   --   < > = values in this interval not displayed.  Cardiac Enzymes No results for input(s): TROPONINI in the last 168 hours.  Microbiology Results  No results found for this or any previous visit.  RADIOLOGY:  Ct Head Wo Contrast  12/13/2014   CLINICAL DATA:  Numbness.  Intoxicated.  Frontal headache.  EXAM: CT HEAD WITHOUT CONTRAST  TECHNIQUE: Contiguous axial images were obtained from the base of the skull through the vertex without intravenous contrast.  COMPARISON:  01/08/2010  FINDINGS: No intracranial hemorrhage, mass effect, or midline shift. No hydrocephalus. The basilar cisterns are patent. No evidence of territorial infarct. No intracranial fluid collection. Mild atrophy, advanced for age. Calvarium is intact. Included paranasal sinuses and mastoid air cells are well aerated.  IMPRESSION: 1.  No acute intracranial abnormality. 2. Atrophy, advanced for age.   Electronically Signed   By: Rubye Oaks M.D.   On: 12/13/2014 05:52   US Abdomen Limited Ruq  12/13/2014   CLINICAL DATA:  Right upper quadrant pain.  History of hepatitis C.  EXAM: US ABDOMEN LIMITED - RIGHT UPPER QUADRANT  COMPARISON:  04/05/2014  FINDINGS: Gallbladder:  No gallstones or wall thickening visualized. No sonographic Murphy sign noted.  Common bile duct:  Diameter: 4.7 mm, normal.  Liver:  No focal lesion identified. Within normal limits in parenchymal echogenicity. Normal directional flow in the main portal vein.  IMPRESSION: Normal right upper quadrant ultrasound.   Electronically Signed   By: Rubye Oaks M.D.   On: 12/13/2014 06:24    EKG:  No orders found for this or any previous visit.    Management plans discussed with the patient, family and they are in agreement.  CODE STATUS:     Code Status Orders        Start     Ordered   12/13/14 0811  Full code   Continuous     12/13/14 0810      TOTAL TIME TAKING CARE OF THIS PATIENT: .     @  on 12/14/2014 at 2:24 PM  Between 7am to 6pm - Pager - [phone]  After 6pm go to [email or web address] - password [email or web address] [facility]  [place] [facility]  Office  [phone]  CC: Primary care physician; No primary care provider on file.

## 2015-01-28 ENCOUNTER — Other Ambulatory Visit: Payer: Self-pay

## 2015-01-28 ENCOUNTER — Telehealth: Payer: Self-pay

## 2015-01-28 DIAGNOSIS — B182 Chronic viral hepatitis C: Secondary | ICD-10-CM

## 2015-01-28 NOTE — Telephone Encounter (Signed)
-----   Message from Myrtis Hopping sent at 12/12/2014  9:50 PM EDT ----- Regarding: Re: Result Follow-up Sent: 12/13/2014  Phone Encounter: 07/13/2014  Call pt to schedule f/u to repeat blood work. GF  > From: Joseph Neal > To: Joseph Neal > Sent: 07/20/2014 11:41 AM > Pt notified. Contact pt to schedule 6months repeat blood work. GF  > From: Joseph Neal > To: Joseph Neal > Sent: 07/19/2014 4:28 PM > LVM for pt to return my call. GF  > From: Joseph Neal, Joseph Neal > To: Joseph Neal > Sent: 07/18/2014 5:53 PM > Let the pt know his viral load was negative and his liver enzyemes were normal.

## 2015-01-31 NOTE — Telephone Encounter (Signed)
Pt returned to and will come by this week to have labs repeated. Lab order printed and put up front.

## 2015-06-15 ENCOUNTER — Emergency Department
Admission: EM | Admit: 2015-06-15 | Discharge: 2015-06-15 | Discharge status: Against Medical Advice | Payer: Medicaid Other | Attending: Emergency Medicine | Admitting: Emergency Medicine

## 2015-06-15 ENCOUNTER — Emergency Department: Payer: Medicaid Other

## 2015-06-15 ENCOUNTER — Encounter: Payer: Self-pay | Admitting: Emergency Medicine

## 2015-06-15 DIAGNOSIS — F10239 Alcohol dependence with withdrawal, unspecified: Secondary | ICD-10-CM | POA: Diagnosis not present

## 2015-06-15 DIAGNOSIS — Y9389 Activity, other specified: Secondary | ICD-10-CM | POA: Diagnosis not present

## 2015-06-15 DIAGNOSIS — Y998 Other external cause status: Secondary | ICD-10-CM | POA: Diagnosis not present

## 2015-06-15 DIAGNOSIS — E871 Hypo-osmolality and hyponatremia: Secondary | ICD-10-CM | POA: Diagnosis not present

## 2015-06-15 DIAGNOSIS — X58XXXA Exposure to other specified factors, initial encounter: Secondary | ICD-10-CM | POA: Insufficient documentation

## 2015-06-15 DIAGNOSIS — S0181XA Laceration without foreign body of other part of head, initial encounter: Secondary | ICD-10-CM | POA: Insufficient documentation

## 2015-06-15 DIAGNOSIS — R251 Tremor, unspecified: Secondary | ICD-10-CM | POA: Diagnosis not present

## 2015-06-15 DIAGNOSIS — Y9289 Other specified places as the place of occurrence of the external cause: Secondary | ICD-10-CM | POA: Diagnosis not present

## 2015-06-15 DIAGNOSIS — R569 Unspecified convulsions: Secondary | ICD-10-CM | POA: Diagnosis not present

## 2015-06-15 DIAGNOSIS — IMO0002 Reserved for concepts with insufficient information to code with codable children: Secondary | ICD-10-CM

## 2015-06-15 DIAGNOSIS — F13939 Sedative, hypnotic or anxiolytic use, unspecified with withdrawal, unspecified: Secondary | ICD-10-CM

## 2015-06-15 DIAGNOSIS — Z7951 Long term (current) use of inhaled steroids: Secondary | ICD-10-CM | POA: Diagnosis not present

## 2015-06-15 DIAGNOSIS — Z79899 Other long term (current) drug therapy: Secondary | ICD-10-CM | POA: Insufficient documentation

## 2015-06-15 DIAGNOSIS — F13239 Sedative, hypnotic or anxiolytic dependence with withdrawal, unspecified: Secondary | ICD-10-CM | POA: Insufficient documentation

## 2015-06-15 DIAGNOSIS — S0101XA Laceration without foreign body of scalp, initial encounter: Secondary | ICD-10-CM | POA: Insufficient documentation

## 2015-06-15 DIAGNOSIS — S01112A Laceration without foreign body of left eyelid and periocular area, initial encounter: Secondary | ICD-10-CM | POA: Diagnosis not present

## 2015-06-15 DIAGNOSIS — S0990XA Unspecified injury of head, initial encounter: Secondary | ICD-10-CM | POA: Diagnosis present

## 2015-06-15 DIAGNOSIS — F10939 Alcohol use, unspecified with withdrawal, unspecified: Secondary | ICD-10-CM

## 2015-06-15 DIAGNOSIS — F17210 Nicotine dependence, cigarettes, uncomplicated: Secondary | ICD-10-CM | POA: Insufficient documentation

## 2015-06-15 LAB — COMPREHENSIVE METABOLIC PANEL
ALBUMIN: 4.6 g/dL (ref 3.5–5.0)
ALK PHOS: 46 U/L (ref 38–126)
ALT: 18 U/L (ref 17–63)
ANION GAP: 15 (ref 5–15)
AST: 32 U/L (ref 15–41)
BUN: 10 mg/dL (ref 6–20)
CALCIUM: 9.3 mg/dL (ref 8.9–10.3)
CO2: 22 mmol/L (ref 22–32)
Chloride: 88 mmol/L — ABNORMAL LOW (ref 101–111)
Creatinine, Ser: 0.91 mg/dL (ref 0.61–1.24)
GFR calc Af Amer: >60 mL/min (ref >60)
GFR calc non Af Amer: >60 mL/min (ref >60)
GLUCOSE: 138 mg/dL — AB (ref 65–99)
Potassium: 4.4 mmol/L (ref 3.5–5.1)
Sodium: 125 mmol/L — ABNORMAL LOW (ref 135–145)
Total Bilirubin: 1.6 mg/dL — ABNORMAL HIGH (ref 0.3–1.2)
Total Protein: 7.8 g/dL (ref 6.5–8.1)

## 2015-06-15 LAB — CBC WITH DIFFERENTIAL/PLATELET
Basophils Absolute: 0.1 10*3/uL (ref 0–0.1)
Basophils Relative: 1 %
Eosinophils Absolute: 0.3 10*3/uL (ref 0–0.7)
Eosinophils Relative: 2 %
HCT: 43.2 % (ref 40.0–52.0)
HEMOGLOBIN: 15.2 g/dL (ref 13.0–18.0)
Lymphocytes Relative: 12 %
Lymphs Abs: 1.4 10*3/uL (ref 1.0–3.6)
MCH: 35 pg — ABNORMAL HIGH (ref 26.0–34.0)
MCHC: 35.2 g/dL (ref 32.0–36.0)
MCV: 99.5 fL (ref 80.0–100.0)
MONOS PCT: 11 %
Monocytes Absolute: 1.1 10*3/uL — ABNORMAL HIGH (ref 0.2–1.0)
NEUTROS ABS: 8.2 10*3/uL — AB (ref 1.4–6.5)
NEUTROS PCT: 74 %
Platelets: 159 10*3/uL (ref 150–440)
RBC: 4.34 MIL/uL — AB (ref 4.40–5.90)
RDW: 13.5 % (ref 11.5–14.5)
WBC: 11 10*3/uL — ABNORMAL HIGH (ref 3.8–10.6)

## 2015-06-15 MED ORDER — ONDANSETRON HCL 4 MG/2ML IJ SOLN
4.0000 mg | Freq: Once | INTRAMUSCULAR | Status: AC
  Administered 2015-06-15: 4 mg via INTRAVENOUS
  Filled 2015-06-15: qty 2

## 2015-06-15 MED ORDER — THIAMINE HCL 100 MG/ML IJ SOLN
100.0000 mg | Freq: Every day | INTRAMUSCULAR | Status: DC
Start: 2015-06-15 — End: 2015-06-15

## 2015-06-15 MED ORDER — LORAZEPAM 2 MG/ML IJ SOLN: 0.0000 mg | Freq: Four times a day (QID) | INTRAMUSCULAR | Status: DC

## 2015-06-15 MED ORDER — CLONAZEPAM 1 MG PO TABS: 1.0000 mg | ORAL_TABLET | Freq: Two times a day (BID) | ORAL | Status: AC

## 2015-06-15 MED ORDER — LIDOCAINE-EPINEPHRINE (PF) 1 %-1:200000 IJ SOLN
INTRAMUSCULAR | Status: AC
  Administered 2015-06-15: 14:00:00
  Filled 2015-06-15: qty 30

## 2015-06-15 MED ORDER — LORAZEPAM 2 MG/ML IJ SOLN: 0.0000 mg | Freq: Two times a day (BID) | INTRAMUSCULAR | Status: DC

## 2015-06-15 MED ORDER — LORAZEPAM 2 MG PO TABS: 0.0000 mg | ORAL_TABLET | Freq: Two times a day (BID) | ORAL | Status: DC

## 2015-06-15 MED ORDER — VITAMIN B-1 100 MG PO TABS: 100.0000 mg | ORAL_TABLET | Freq: Every day | ORAL | Status: DC

## 2015-06-15 MED ORDER — TETANUS-DIPHTHERIA TOXOIDS TD 5-2 LFU IM INJ
0.5000 mL | INJECTION | Freq: Once | INTRAMUSCULAR | Status: DC
  Filled 2015-06-15: qty 0.5

## 2015-06-15 MED ORDER — LORAZEPAM 2 MG PO TABS: 0.0000 mg | ORAL_TABLET | Freq: Four times a day (QID) | ORAL | Status: DC

## 2015-06-15 MED ORDER — SODIUM CHLORIDE 0.9 % IV BOLUS (SEPSIS)
1000.0000 mL | Freq: Once | INTRAVENOUS | Status: AC
  Administered 2015-06-15: 1000 mL via INTRAVENOUS

## 2015-06-15 MED ORDER — LORAZEPAM 1 MG PO TABS: 1.0000 mg | ORAL_TABLET | Freq: Once | ORAL | Status: DC

## 2015-06-15 NOTE — ED Notes (Signed)
Dressing applied to patient's forehead. MD at bedside to discuss patient hospital. Pt states that "there is no way around it" and that he is not able to stay. Pt given phone to call ride.

## 2015-06-15 NOTE — ED Notes (Signed)
Pt taken to CT.

## 2015-06-15 NOTE — ED Notes (Signed)
Pt refuses to wait for tetnus shot from pharm

## 2015-06-15 NOTE — ED Notes (Signed)
Pharmacy called regarding lack of tetanus in pyxis in ED, will send to ED. Pt will leave once administered.

## 2015-06-15 NOTE — Discharge Instructions (Signed)
Please return to the emergency department at any time for further medical treatment. Alcohol Withdrawal Alcohol withdrawal is a group of symptoms that can develop when a person who drinks heavily and regularly stops drinking or drinks less. CAUSES Heavy and regular drinking can cause chemicals that send signals from the brain to the body (neurotransmitters) to deactivate. Alcohol withdrawal develops when deactivated neurotransmitters reactivate because a person stops drinking or drinks less. RISK FACTORS The more a person drinks and the longer he or she drinks, the greater the risk of alcohol withdrawal. Severe withdrawal is more likely to develop in someone who:  Had severe alcohol withdrawal in the past.  Had a seizure during a previous episode of alcohol withdrawal.  Is elderly.  Is pregnant.  Has been abusing drugs.  Has other medical problems, including:  Infection.  Heart, lung, or liver disease.  Seizures.  Mental health problems. SYMPTOMS Symptoms of this condition can be mild to moderate, or they can be severe. Mild to moderate symptoms may include:  Fatigue.  Nightmares.  Trouble sleeping.  Depression.  Anxiety.  Inability to think clearly.  Mood swings.  Irritability.  Loss of appetite.  Nausea or vomiting.  Clammy skin.  Extreme sweating.  Rapid heartbeat.  Shakiness.  Uncontrollable shaking (tremor). Severe symptoms may include:  Fever.  Seizures.  Severeconfusion.  Feeling or seeing things that are not there (hallucinations). Symptoms usually begin within eight hours after a person stops drinking or drinks less. They can last for weeks. DIAGNOSIS Alcohol withdrawal is diagnosed with a medical history and physical exam. Sometimes, urine and blood tests are also done. TREATMENT Treatment may involve:  Monitoring blood pressure, pulse, and breathing.  Getting fluids through an IV tube.  Medicine to reduce anxiety.  Medicine  to prevent or control seizures.  Multivitamins and B vitamins.  Having a health care provider check on you daily. If symptoms are moderate to severe or if there is a risk of severe withdrawal, treatment may be done at a hospital or treatment center. HOME CARE INSTRUCTIONS  Take medicines and vitamin supplements only as directed by your health care provider.  Do not drink alcohol.  Have someone stay with you or be available if you need help.  Drink enough fluid to keep your urine clear or pale yellow.  Consider joining a 12-step program or another alcohol support group. SEEK MEDICAL CARE IF:  Your symptoms get worse or do not go away.  You cannot keep food or water in your stomach.  You are struggling with not drinking alcohol.  You cannot stop drinking alcohol. SEEK IMMEDIATE MEDICAL CARE IF:   You have an irregular heartbeat.  You have chest pain.  You have trouble breathing.  You have symptoms of severe withdrawal, such as:  A fever.  Seizures.  Severe confusion.  Hallucinations.   This information is not intended to replace advice given to you by your health care provider. Make sure you discuss any questions you have with your health care provider.   Document Released: 04/04/2005 Document Revised: 07/16/2014 Document Reviewed: 04/13/2014 Elsevier Interactive Patient Education 2016 [employer] Inc[employer]  Benzodiazepine Withdrawal  Benzodiazepines are a group of drugs that are prescribed for both short-term and long-term treatment of a variety of medical conditions. For some of these conditions, such as seizures and sudden and severe muscle spasms, they are used only for a few hours or a few days. For other conditions, such as anxiety, sleep problems, or frequent muscle spasms or to  help prevent seizures, they are used for an extended period, usually weeks or months. Benzodiazepines work by changing the way your brain functions. Normally, chemicals in your brain called  neurotransmitters send messages between your brain cells. The neurotransmitter that benzodiazepines affect is called gamma-aminobutyric acid (GABA). GABA sends out messages that have a calming effect on many of the functions of your brain. Benzodiazepines make these messages stronger and increase this calming effect. Short-term use of benzodiazepines usually does not cause problems when you stop taking the drugs. However, if you take benzodiazepines for a long time, your body can adjust to the drug and require more of it to produce the same effect (drug tolerance). Eventually, you can develop physical dependence on benzodiazepines, which is when you experience negative effects if your dosage of benzodiazepines is reduced or stopped too quickly. These negative effects are called symptoms of withdrawal. SYMPTOMS Symptoms of withdrawal may begin anytime within the first 10 days after you stop taking the benzodiazepine. They can last from several weeks up to a few months but usually are the worst between the first 10 to 14 days.  The actual symptoms also vary, depending on the type of benzodiazepine you take. Possible symptoms include:  Anxiety.  Excitability.  Irritability.  Depression.  Mood swings.  Trouble sleeping.  Confusion.  Uncontrollable shaking (tremors).  Muscle weakness.  Seizures. DIAGNOSIS To diagnose benzodiazepine withdrawal, your caregiver will examine you for certain signs, such as:  Rapid heartbeat.  Rapid breathing.  Tremors.  High blood pressure.  Fever.  Mood changes. Your caregiver also may ask the following questions about your use of benzodiazepines:  What type of benzodiazepine did you take?  How much did you take each day?  How long did you take the drug?  When was the last time you took the drug?  Do you take any other drugs?  Have you had alcohol recently?  Have you had a seizure recently?  Have you lost consciousness recently?  Have  you had trouble remembering recent events?  Have you had a recent increase in anxiety, irritability, or trouble sleeping? A drug test also may be administered. TREATMENT The treatment for benzodiazepine withdrawal can vary, depending on the type and severity of your symptoms, what type of benzodiazepine you have been taking, and how long you have been taking the benzodiazepine. Sometimes it is necessary for you to be treated in a hospital, especially if you are at risk of seizures.  Often, treatment includes a prescription for a long-acting benzodiazepine, the dosage of which is reduced slowly over a long period. This period could be several weeks or months. Eventually, your dosage will be reduced to a point that you can stop taking the drug, without experiencing withdrawal symptoms. This is called tapered withdrawal. Occasionally, minor symptoms of withdrawal continue for a few days or weeks after you have completed a tapered withdrawal. SEEK IMMEDIATE MEDICAL CARE IF:  You have a seizure.  You develop a craving for drugs or alcohol.  You begin to experience symptoms of withdrawal during your tapered withdrawal.  You become very confused.  You lose consciousness.  You have trouble breathing.  You think about hurting yourself or someone else.   This information is not intended to replace advice given to you by your health care provider. Make sure you discuss any questions you have with your health care provider.   Document Released: 06/14/2011 Document Revised: 07/16/2014 Document Reviewed: 12/15/2014 Elsevier Interactive Patient Education [employer]  Seizure,  Adult A seizure is abnormal electrical activity in the brain. Seizures usually last from 30 seconds to 2 minutes. There are various types of seizures. Before a seizure, you may have a warning sensation (aura) that a seizure is about to occur. An aura may include the following symptoms:   Fear or  anxiety.  Nausea.  Feeling like the room is spinning (vertigo).  Vision changes, such as seeing flashing lights or spots. Common symptoms during a seizure include:  A change in attention or behavior (altered mental status).  Convulsions with rhythmic jerking movements.  Drooling.  Rapid eye movements.  Grunting.  Loss of bladder and bowel control.  Bitter taste in the mouth.  Tongue biting. After a seizure, you may feel confused and sleepy. You may also have an injury resulting from convulsions during the seizure. HOME CARE INSTRUCTIONS   If you are given medicines, take them exactly as prescribed by your health care provider.  Keep all follow-up appointments as directed by your health care provider.  Do not swim or drive or engage in risky activity during which a seizure could cause further injury to you or others until your health care provider says it is OK.  Get adequate rest.  Teach friends and family what to do if you have a seizure. They should:  Lay you on the ground to prevent a fall.  Put a cushion under your head.  Loosen any tight clothing around your neck.  Turn you on your side. If vomiting occurs, this helps keep your airway clear.  Stay with you until you recover.  Know whether or not you need emergency care. SEEK IMMEDIATE MEDICAL CARE IF:  The seizure lasts longer than 5 minutes.  The seizure is severe or you do not wake up immediately after the seizure.  You have an altered mental status after the seizure.  You are having more frequent or worsening seizures. Someone should drive you to the emergency department or call local emergency services (911 in [place] MAKE SURE YOU:  Understand these instructions.  Will watch your condition.  Will get help right away if you are not doing well or get worse.   This information is not intended to replace advice given to you by your health care provider. Make sure you discuss any questions you  have with your health care provider.   Document Released: 06/22/2000 Document Revised: 07/16/2014 Document Reviewed: 02/04/2013 Elsevier Interactive Patient Education 2016 Elsevier Inc[employer]  Hyponatremia Hyponatremia is when the amount of salt (sodium) in your blood is too low. When salt levels are low, your cells absorb extra water and they swell. The swelling happens throughout the body, but it mostly affects the brain.  HOME CARE  Take medicines only as told by your doctor. Many medicines can make this condition worse. Talk with your doctor about any medicines that you are currently taking.  Carefully follow a recommended diet as told by your doctor.  Carefully follow instructions from your doctor about fluid restrictions.  Keep all follow-up visits as told by your doctor. This is important.  Do not drink alcohol. GET HELP IF:  You feel sicker to your stomach (nauseous).  You feel more confused.  You feel more tired (fatigued).  Your headache gets worse.  You feel weaker.  Your symptoms go away and then they come back.  You have trouble following the diet instructions. GET HELP RIGHT AWAY IF:  You start to twitch and shake (have a seizure).  You pass out (  faint).  You keep having watery poop (diarrhea).  You keep throwing up (vomiting).   This information is not intended to replace advice given to you by your health care provider. Make sure you discuss any questions you have with your health care provider.   Document Released: 03/07/2011 Document Revised: 11/09/2014 Document Reviewed: 06/21/2014 Elsevier Interactive Patient Education [employer]

## 2015-06-15 NOTE — ED Notes (Signed)
Pt noted to be bleeding through dressing. This RN at bedside to pack wound with thick abdominal dressing with curlex wrapped around his head. Bleeding controlled at this time.

## 2015-06-15 NOTE — ED Provider Notes (Signed)
[facility] Emergency Department Provider Note  ____________________________________________  Time seen: Approximately 145 PM  I have reviewed the triage vital signs and the nursing notes.   HISTORY  Chief Complaint Seizures and Head Injury    HPI Joseph Neal is a 54 y.o. male with a history of alcoholism who is presenting today with a seizure. He said that he was sitting watching TV when he had a seizure earlier today. He says that he has not had a drink since yesterday but usually drinks a sixpack a day. He says he is also been off his Klonopin since December 1. When he had the seizure he suffered a laceration over his left eye as well as the left temple. He says he has had one seizure before over the summer but is not sure why. He does not take any seizure medication.   Past Medical History  Diagnosis Date  . Tachycardia   . High cholesterol   . Rib fractures   . COPD (chronic obstructive pulmonary disease) (HCC)   . Valvular insufficiency   . Cardiomyopathy, alcoholic [facility]     Patient Active Problem List   Diagnosis Date Noted  . Hyponatremia 12/13/2014    Past Surgical History  Procedure Laterality Date  . Back surgery      Current Outpatient Rx  Name  Route  Sig  Dispense  Refill  . albuterol (PROVENTIL HFA;VENTOLIN HFA) 108 (90 BASE) MCG/ACT inhaler   Inhalation   Inhale 2 puffs into the lungs every 6 (six) hours as needed for wheezing or shortness of breath.         . carvedilol (COREG) 12.5 MG tablet   Oral   Take 12.5 mg by mouth 2 (two) times daily.      5   . levothyroxine (SYNTHROID, LEVOTHROID) 25 MCG tablet   Oral   Take 1 tablet by mouth daily.      5   . lisinopril (PRINIVIL,ZESTRIL) 10 MG tablet   Oral   Take 10 mg by mouth daily.         . metoprolol succinate (TOPROL-XL) 100 MG 24 hr tablet   Oral   Take 100 mg by mouth daily.          . pantoprazole (PROTONIX) 40 MG tablet   Oral   Take 40 mg by  mouth daily.      5   . pravastatin (PRAVACHOL) 40 MG tablet   Oral   Take 40 mg by mouth daily.         Marland Kitchen tiotropium (SPIRIVA) 18 MCG inhalation capsule   Inhalation   Place 18 mcg into inhaler and inhale daily.         . nicotine (NICODERM CQ - DOSED IN MG/24 HOURS) 14 mg/24hr patch   Transdermal   Place 1 patch (14 mg total) onto the skin daily.   28 patch   0     Allergies Review of patient's allergies indicates no known allergies.  Family History  Problem Relation Age of Onset  . Coronary artery disease Father   . COPD Brother     Social History Social History  Substance Use Topics  . Smoking status: Current Every Day Smoker -- 0.50 packs/day for 40 years    Types: Cigarettes  . Smokeless tobacco: Never Used  . Alcohol Use: 25.2 oz/week    21 Cans of beer, 21 Standard drinks or equivalent per week     Comment: Pt states 6pack/day  Review of Systems Constitutional: No fever/chills Eyes: No visual changes. ENT: No sore throat. Cardiovascular: Denies chest pain. Respiratory: Denies shortness of breath. Gastrointestinal: No abdominal pain.  No nausea, no vomiting.  No diarrhea.  No constipation. Genitourinary: Negative for dysuria. Musculoskeletal: Negative for back pain. Skin: Negative for rash. Neurological: Pain in the left side of his head of the laceration site, no focal weakness or numbness.  10-point ROS otherwise negative.  ____________________________________________   PHYSICAL EXAM:  VITAL SIGNS: ED Triage Vitals  Enc Vitals Group     BP 06/15/15 1218 170/107 mmHg     Pulse Rate 06/15/15 1218 100     Resp 06/15/15 1218 20     Temp 06/15/15 1218 97.7 F (36.5 C)     Temp Source 06/15/15 1218 Oral     SpO2 06/15/15 1212 98 %     Weight 06/15/15 1218 140 lb (63.504 kg)     Height 06/15/15 1218  (1.753 m)     Head Cir --      Peak Flow --      Pain Score 06/15/15 1218 2     Pain Loc --      Pain Edu? --      Excl. in GC? --      Constitutional: Alert and oriented. Well appearing and in no acute distress. Eyes: Conjunctivae are normal. PERRL. EOMI. Head: Large left temporal hematoma. Overlying curvilinear 5 cm laceration to the subcutaneous level. Also with one similar laceration of vertical orientation just below the right eye. Also with laceration to the left eyebrow. Nose: No congestion/rhinnorhea. Mouth/Throat: Mucous membranes are moist.  Oropharynx non-erythematous. Neck: No stridor.   Cardiovascular: Normal rate, regular rhythm. Grossly normal heart sounds.  Good peripheral circulation. Respiratory: Normal respiratory effort.  No retractions. Lungs CTAB. Gastrointestinal: Soft and nontender. No distention. No abdominal bruits. No CVA tenderness. Musculoskeletal: No lower extremity tenderness nor edema.  No joint effusions. Neurologic:  Normal speech and language. No gross focal neurologic deficits are appreciated. No gait instability. Tremulous to his fingers. Skin:  Skin is warm, dry and intact. No rash noted. Psychiatric: Mood and affect are normal. Speech and behavior are normal.  ____________________________________________   LABS (all labs ordered are listed, but only abnormal results are displayed)  Labs Reviewed  CBC WITH DIFFERENTIAL/PLATELET - Abnormal; Notable for the following:    WBC 11.0 (*)    RBC 4.34 (*)    MCH 35.0 (*)    Neutro Abs 8.2 (*)    Monocytes Absolute 1.1 (*)    All other components within normal limits  COMPREHENSIVE METABOLIC PANEL - Abnormal; Notable for the following:    Sodium 125 (*)    Chloride 88 (*)    Glucose, Bld 138 (*)    Total Bilirubin 1.6 (*)    All other components within normal limits   ____________________________________________  EKG  ED ECG REPORT I, Arelia Longest, the attending physician, personally viewed and interpreted this ECG.   Date: 06/15/2015  EKG Time: 1203  Rate: 104  Rhythm: sinus tachycardia  Axis: Rightward axis  deviation.  Intervals:none  ST&T Change: No ST segment elevation or depression. No abnormal T-wave inversion. Poor baseline.  ____________________________________________  RADIOLOGY  CT of the head and neck without any acute intracranial abnormality. Left frontal scalp laceration. ____________________________________________   PROCEDURES  LACERATION REPAIR Performed by: Arelia Longest Authorized by: Arelia Longest Consent: Verbal consent obtained. Risks and benefits: risks, benefits and  alternatives were discussed Consent given by: patient Patient identity confirmed: provided demographic data Prepped and Draped in normal sterile fashion Wound explored  Laceration Location: Left temple, inferior to the left eye as well as a left eyebrow.  Laceration Length: 5 cm, 1 cm and 3 cm respectively cm  No Foreign Bodies seen or palpated  Anesthesia: local infiltration  Local anesthetic: lidocaine 1 % with epinephrine  Anesthetic total: 5 ml  Irrigation method: syringe Amount of cleaning: standard  Skin closure: Cutaneous   Number of sutures: 13   Technique: Simple interrupted   Patient tolerance: Patient tolerated the procedure well with no immediate complications. Good cosmesis as well as no active bleeding at the completion of the procedure.   ____________________________________________   INITIAL IMPRESSION / ASSESSMENT AND PLAN / ED COURSE  Pertinent labs & imaging results that were available during my care of the patient were reviewed by me and considered in my medical decision making (see chart for details).  ----------------------------------------- 2:30 PM on 06/15/2015 -----------------------------------------  Patient with hyponatremia as well as seizure which I suspect is secondary to alcohol as well as withdrawal of his Klonopin. We'll admit to the hospital. CIWA scale ordered.  ----------------------------------------- 3:52 PM on  06/15/2015 -----------------------------------------  Patient still tachycardic and tremulous. I discussed the need for admission but he is refusing to go home. He is now the bedside with multiple of his relatives including his brother, sister-in-law as well as roommate Reita Cliche(Bobby).  He is denying any pain at this time. I reviewed with him and his family risks of leaving AGAINST MEDICAL ADVICE including further seizures, permanent disability and death. He understands these risks but still says he would like to go home. His family says that he is acting normally for him. He has insight into his medical condition and accepts her for possibility for leaving AGAINST MEDICAL ADVICE at this time. I counseled him further as well as the family that he may return at any time for further medical management of his seizures as well as his sodium levels. The patient understands these risks as well as his plan. He has decisional capacity at this time. He is not acting intoxicated or altered. He also knows to go to his primary care doctor in 7 days for removal of his stitches. ____________________________________________   FINAL CLINICAL IMPRESSION(S) / ED DIAGNOSES  Seizure, alcohol withdrawal, facial as well as scalp lacerations.     Myrna Blazeravid Matthew Devynn Scheff, MD 06/15/15 [phone]

## 2015-06-15 NOTE — ED Notes (Signed)
MD at bedside at this time to suture patient's wounds.

## 2015-06-15 NOTE — ED Notes (Signed)
Per EMS pt had a witnessed seizure by his roommate and was found in the floor. Pt presents to ED via [facility] with a Laceration to L side of head and above L eyebrow. EMS states that pt hit head on night stand. Per EMS pt is non-compliant with home medications but is not on seizure medications. EMS states that pt has had a seizure int he past but was not prescribed seizure medications. Pt states that he takes 1mg  of Klonpin 3x a day but has been out since Dec 1.

## 2015-11-07 DEATH — deceased
